# Patient Record
Sex: Female | Born: 1945
Health system: Southern US, Community
[De-identification: ages and names within clinical notes are randomized; demographics above are authoritative.]

## PROBLEM LIST (undated history)

## (undated) DIAGNOSIS — S82121A Displaced fracture of lateral condyle of right tibia, initial encounter for closed fracture: Secondary | ICD-10-CM

## (undated) DIAGNOSIS — E785 Hyperlipidemia, unspecified: Secondary | ICD-10-CM

## (undated) DIAGNOSIS — I10 Essential (primary) hypertension: Secondary | ICD-10-CM

## (undated) DIAGNOSIS — F419 Anxiety disorder, unspecified: Secondary | ICD-10-CM

## (undated) HISTORY — PX: ABDOMINAL HYSTERECTOMY: SHX81

---

## 1998-04-02 ENCOUNTER — Other Ambulatory Visit: Admission: RE | Admit: 1998-04-02 | Discharge: 1998-04-02 | Payer: Self-pay | Admitting: Obstetrics and Gynecology

## 1998-07-03 ENCOUNTER — Encounter: Payer: Self-pay | Admitting: Emergency Medicine

## 1998-07-03 ENCOUNTER — Emergency Department (HOSPITAL_COMMUNITY): Admission: EM | Admit: 1998-07-03 | Discharge: 1998-07-03 | Payer: Self-pay | Admitting: *Deleted

## 1999-05-16 HISTORY — PX: NECK SURGERY: SHX720

## 1999-11-04 ENCOUNTER — Emergency Department (HOSPITAL_COMMUNITY): Admission: EM | Admit: 1999-11-04 | Discharge: 1999-11-04 | Payer: Self-pay | Admitting: Emergency Medicine

## 1999-11-04 ENCOUNTER — Encounter: Payer: Self-pay | Admitting: Emergency Medicine

## 2002-03-04 ENCOUNTER — Other Ambulatory Visit: Admission: RE | Admit: 2002-03-04 | Discharge: 2002-03-04 | Payer: Self-pay | Admitting: Family Medicine

## 2003-01-30 ENCOUNTER — Ambulatory Visit (HOSPITAL_COMMUNITY): Admission: RE | Admit: 2003-01-30 | Discharge: 2003-01-30 | Payer: Self-pay | Admitting: Family Medicine

## 2003-01-30 ENCOUNTER — Encounter: Payer: Self-pay | Admitting: Family Medicine

## 2004-04-21 ENCOUNTER — Ambulatory Visit (HOSPITAL_COMMUNITY): Admission: RE | Admit: 2004-04-21 | Discharge: 2004-04-21 | Payer: Self-pay | Admitting: Family Medicine

## 2004-09-06 ENCOUNTER — Encounter: Admission: RE | Admit: 2004-09-06 | Discharge: 2004-09-06 | Payer: Self-pay | Admitting: Family Medicine

## 2004-11-12 ENCOUNTER — Emergency Department (HOSPITAL_COMMUNITY): Admission: EM | Admit: 2004-11-12 | Discharge: 2004-11-12 | Payer: Self-pay | Admitting: Emergency Medicine

## 2005-03-15 ENCOUNTER — Ambulatory Visit (HOSPITAL_COMMUNITY): Admission: RE | Admit: 2005-03-15 | Discharge: 2005-03-15 | Payer: Self-pay | Admitting: Gastroenterology

## 2005-08-17 ENCOUNTER — Ambulatory Visit (HOSPITAL_COMMUNITY): Admission: RE | Admit: 2005-08-17 | Discharge: 2005-08-17 | Payer: Self-pay | Admitting: Family Medicine

## 2006-11-02 ENCOUNTER — Ambulatory Visit (HOSPITAL_COMMUNITY): Admission: RE | Admit: 2006-11-02 | Discharge: 2006-11-02 | Payer: Self-pay | Admitting: Family Medicine

## 2007-02-05 ENCOUNTER — Emergency Department (HOSPITAL_COMMUNITY): Admission: EM | Admit: 2007-02-05 | Discharge: 2007-02-05 | Payer: Self-pay | Admitting: Emergency Medicine

## 2008-07-30 ENCOUNTER — Ambulatory Visit (HOSPITAL_COMMUNITY): Admission: RE | Admit: 2008-07-30 | Discharge: 2008-07-30 | Payer: Self-pay | Admitting: Family Medicine

## 2010-02-08 ENCOUNTER — Ambulatory Visit (HOSPITAL_COMMUNITY): Admission: RE | Admit: 2010-02-08 | Discharge: 2010-02-08 | Payer: Self-pay | Admitting: Family Medicine

## 2010-06-05 ENCOUNTER — Encounter: Payer: Self-pay | Admitting: Family Medicine

## 2010-09-30 NOTE — Op Note (Signed)
NAME:  Heather Houston, Heather Houston NO.:  [id number]   MEDICAL RECORD NO.:  [id number]          PATIENT TYPE:  AMB   LOCATION:  ENDO                         FACILITY:  [facility]   PHYSICIAN:  Anselmo Rod, M.D.  DATE OF BIRTH:  10-05-1945   DATE OF PROCEDURE:  03/15/2005  DATE OF DISCHARGE:                                 OPERATIVE REPORT   PROCEDURE PERFORMED:  Screening colonoscopy.   ENDOSCOPIST:  Charna Elizabeth, M.D.   INSTRUMENT USED:  Olympus video colonoscope.   INDICATIONS FOR PROCEDURE:  65 year old African-American female with history  of occasional rectal bleeding.  Rule out colonic polyps, masses, etc.   PREPROCEDURE PREPARATION:  Informed consent was procured from the patient.  The patient was fasted for eight hours prior to the procedure and prepped  with a bottle of magnesium citrate and a gallon of GoLytely the night prior  to the procedure.  The risks and benefits of the procedure including a 10%  miss rate for polyps or cancers was discussed with the patient as well.   PREPROCEDURE PHYSICAL:  The patient had stable vital signs.  Neck supple.  Chest clear to auscultation.  S1 and S2 regular.  Abdomen soft with normal  bowel sounds.   DESCRIPTION OF PROCEDURE:  The patient was placed in left lateral decubitus  position and sedated with 130 mg of Demerol and 12.5 mg of Versed in slow  incremental doses.  Once the patient was adequately sedated and maintained  on low flow oxygen and continuous cardiac monitoring, the Olympus video  colonoscope was advanced from the rectum to the transverse colon with  difficulty.  The scope could not be advanced beyond that point and therefore  the adult scope was withdrawn and the pediatric scope was used instead.  We  were able to advance the pediatric scope up to the cecum with gentle turning  of the patient from the left lateral to the supine and the right lateral  position with application of abdominal pressure.  The  appendicular orifice  and ileocecal valve were clearly visualized and photographed.  Small  internal hemorrhoids were seen on retroflexion. No masses or polyps were  identified.  There was no evidence of diverticulosis.  The patient tolerated  the procedure well without complication.   IMPRESSION:  1.  Normal colonoscopy up to the cecum.  No masses, polyps, or      diverticulosis identified.  2.  Small internal hemorrhoids seen on retroflexion.   RECOMMENDATIONS:  1.  Continue high fiber diet with liberal fluid intake.  2.  Repeat colonoscopy in the next 10 years unless the patient were to      develop any abnormal symptoms in the interim.  3.  Outpatient followup as need arises in the future.      Anselmo Rod, M.D.  Electronically Signed     JNM/MEDQ  D:  03/15/2005  T:  03/15/2005  Job:  [id number]

## 2011-04-03 ENCOUNTER — Other Ambulatory Visit (HOSPITAL_COMMUNITY): Payer: Self-pay | Admitting: Family Medicine

## 2011-04-03 DIAGNOSIS — Z1231 Encounter for screening mammogram for malignant neoplasm of breast: Secondary | ICD-10-CM

## 2011-05-04 ENCOUNTER — Ambulatory Visit (HOSPITAL_COMMUNITY)
Admission: RE | Admit: 2011-05-04 | Discharge: 2011-05-04 | Disposition: A | Discharge status: Home / Self Care | Payer: PRIVATE HEALTH INSURANCE | Source: Ambulatory Visit | Attending: Family Medicine | Admitting: Family Medicine

## 2011-05-04 DIAGNOSIS — Z1231 Encounter for screening mammogram for malignant neoplasm of breast: Secondary | ICD-10-CM | POA: Insufficient documentation

## 2012-05-27 ENCOUNTER — Other Ambulatory Visit (HOSPITAL_COMMUNITY): Payer: Self-pay | Admitting: Family Medicine

## 2012-05-27 DIAGNOSIS — Z1231 Encounter for screening mammogram for malignant neoplasm of breast: Secondary | ICD-10-CM

## 2012-05-31 ENCOUNTER — Ambulatory Visit (HOSPITAL_COMMUNITY): Payer: PRIVATE HEALTH INSURANCE

## 2012-06-07 ENCOUNTER — Other Ambulatory Visit (HOSPITAL_COMMUNITY): Payer: Self-pay | Admitting: Family Medicine

## 2012-06-07 DIAGNOSIS — Z79890 Hormone replacement therapy (postmenopausal): Secondary | ICD-10-CM

## 2012-06-10 ENCOUNTER — Ambulatory Visit (HOSPITAL_COMMUNITY): Payer: PRIVATE HEALTH INSURANCE | Attending: Family Medicine

## 2012-06-10 ENCOUNTER — Ambulatory Visit (HOSPITAL_COMMUNITY): Payer: PRIVATE HEALTH INSURANCE

## 2012-07-05 ENCOUNTER — Ambulatory Visit (HOSPITAL_COMMUNITY): Payer: PRIVATE HEALTH INSURANCE

## 2012-07-17 ENCOUNTER — Ambulatory Visit (HOSPITAL_COMMUNITY)
Admission: RE | Admit: 2012-07-17 | Discharge: 2012-07-17 | Disposition: A | Discharge status: Home / Self Care | Payer: PRIVATE HEALTH INSURANCE | Source: Ambulatory Visit | Attending: Family Medicine | Admitting: Family Medicine

## 2012-07-17 DIAGNOSIS — Z1231 Encounter for screening mammogram for malignant neoplasm of breast: Secondary | ICD-10-CM | POA: Insufficient documentation

## 2012-07-17 DIAGNOSIS — Z79890 Hormone replacement therapy (postmenopausal): Secondary | ICD-10-CM | POA: Insufficient documentation

## 2012-07-17 DIAGNOSIS — Z13820 Encounter for screening for osteoporosis: Secondary | ICD-10-CM | POA: Insufficient documentation

## 2012-07-17 DIAGNOSIS — Z780 Asymptomatic menopausal state: Secondary | ICD-10-CM | POA: Insufficient documentation

## 2013-09-24 ENCOUNTER — Other Ambulatory Visit (HOSPITAL_COMMUNITY): Payer: Self-pay | Admitting: Family Medicine

## 2013-09-24 DIAGNOSIS — Z1231 Encounter for screening mammogram for malignant neoplasm of breast: Secondary | ICD-10-CM

## 2013-09-30 ENCOUNTER — Ambulatory Visit (HOSPITAL_COMMUNITY): Payer: PRIVATE HEALTH INSURANCE

## 2013-10-01 ENCOUNTER — Ambulatory Visit (HOSPITAL_COMMUNITY)
Admission: RE | Admit: 2013-10-01 | Discharge: 2013-10-01 | Disposition: A | Discharge status: Home / Self Care | Payer: PRIVATE HEALTH INSURANCE | Source: Ambulatory Visit | Attending: Family Medicine | Admitting: Family Medicine

## 2013-10-01 DIAGNOSIS — Z1231 Encounter for screening mammogram for malignant neoplasm of breast: Secondary | ICD-10-CM

## 2014-10-08 DIAGNOSIS — Z79899 Other long term (current) drug therapy: Secondary | ICD-10-CM | POA: Diagnosis not present

## 2014-10-08 DIAGNOSIS — E780 Pure hypercholesterolemia: Secondary | ICD-10-CM | POA: Diagnosis not present

## 2014-10-13 ENCOUNTER — Other Ambulatory Visit (HOSPITAL_COMMUNITY): Payer: Self-pay | Admitting: Family Medicine

## 2014-10-13 DIAGNOSIS — Z1231 Encounter for screening mammogram for malignant neoplasm of breast: Secondary | ICD-10-CM

## 2014-10-13 DIAGNOSIS — Z79890 Hormone replacement therapy (postmenopausal): Secondary | ICD-10-CM | POA: Diagnosis not present

## 2014-10-13 DIAGNOSIS — M545 Low back pain: Secondary | ICD-10-CM | POA: Diagnosis not present

## 2014-10-13 DIAGNOSIS — E780 Pure hypercholesterolemia: Secondary | ICD-10-CM | POA: Diagnosis not present

## 2014-10-13 DIAGNOSIS — Z9181 History of falling: Secondary | ICD-10-CM | POA: Diagnosis not present

## 2014-10-13 DIAGNOSIS — I10 Essential (primary) hypertension: Secondary | ICD-10-CM | POA: Diagnosis not present

## 2014-10-20 ENCOUNTER — Ambulatory Visit (HOSPITAL_COMMUNITY)
Admission: RE | Admit: 2014-10-20 | Discharge: 2014-10-20 | Disposition: A | Discharge status: Home / Self Care | Payer: Medicare Other | Source: Ambulatory Visit | Attending: Family Medicine | Admitting: Family Medicine

## 2014-10-20 DIAGNOSIS — Z1231 Encounter for screening mammogram for malignant neoplasm of breast: Secondary | ICD-10-CM | POA: Diagnosis not present

## 2015-01-13 DIAGNOSIS — Z111 Encounter for screening for respiratory tuberculosis: Secondary | ICD-10-CM | POA: Diagnosis not present

## 2015-03-22 DIAGNOSIS — H25813 Combined forms of age-related cataract, bilateral: Secondary | ICD-10-CM | POA: Diagnosis not present

## 2015-04-09 DIAGNOSIS — Z79899 Other long term (current) drug therapy: Secondary | ICD-10-CM | POA: Diagnosis not present

## 2015-04-09 DIAGNOSIS — E7800 Pure hypercholesterolemia, unspecified: Secondary | ICD-10-CM | POA: Diagnosis not present

## 2015-04-13 DIAGNOSIS — Z1389 Encounter for screening for other disorder: Secondary | ICD-10-CM | POA: Diagnosis not present

## 2015-04-13 DIAGNOSIS — F329 Major depressive disorder, single episode, unspecified: Secondary | ICD-10-CM | POA: Diagnosis not present

## 2015-04-13 DIAGNOSIS — Z23 Encounter for immunization: Secondary | ICD-10-CM | POA: Diagnosis not present

## 2015-04-13 DIAGNOSIS — I10 Essential (primary) hypertension: Secondary | ICD-10-CM | POA: Diagnosis not present

## 2015-04-13 DIAGNOSIS — M545 Low back pain: Secondary | ICD-10-CM | POA: Diagnosis not present

## 2015-04-13 DIAGNOSIS — E7800 Pure hypercholesterolemia, unspecified: Secondary | ICD-10-CM | POA: Diagnosis not present

## 2015-05-26 DIAGNOSIS — J019 Acute sinusitis, unspecified: Secondary | ICD-10-CM | POA: Diagnosis not present

## 2015-05-26 DIAGNOSIS — Z6827 Body mass index (BMI) 27.0-27.9, adult: Secondary | ICD-10-CM | POA: Diagnosis not present

## 2015-05-26 DIAGNOSIS — H6123 Impacted cerumen, bilateral: Secondary | ICD-10-CM | POA: Diagnosis not present

## 2015-09-03 DIAGNOSIS — B373 Candidiasis of vulva and vagina: Secondary | ICD-10-CM | POA: Diagnosis not present

## 2015-09-03 DIAGNOSIS — Z6827 Body mass index (BMI) 27.0-27.9, adult: Secondary | ICD-10-CM | POA: Diagnosis not present

## 2015-09-03 DIAGNOSIS — Z139 Encounter for screening, unspecified: Secondary | ICD-10-CM | POA: Diagnosis not present

## 2015-09-03 DIAGNOSIS — R300 Dysuria: Secondary | ICD-10-CM | POA: Diagnosis not present

## 2015-10-13 DIAGNOSIS — E7800 Pure hypercholesterolemia, unspecified: Secondary | ICD-10-CM | POA: Diagnosis not present

## 2015-10-13 DIAGNOSIS — Z79899 Other long term (current) drug therapy: Secondary | ICD-10-CM | POA: Diagnosis not present

## 2015-10-15 DIAGNOSIS — M545 Low back pain: Secondary | ICD-10-CM | POA: Diagnosis not present

## 2015-10-15 DIAGNOSIS — F329 Major depressive disorder, single episode, unspecified: Secondary | ICD-10-CM | POA: Diagnosis not present

## 2015-10-15 DIAGNOSIS — Z9181 History of falling: Secondary | ICD-10-CM | POA: Diagnosis not present

## 2015-10-15 DIAGNOSIS — I10 Essential (primary) hypertension: Secondary | ICD-10-CM | POA: Diagnosis not present

## 2015-10-15 DIAGNOSIS — R002 Palpitations: Secondary | ICD-10-CM | POA: Diagnosis not present

## 2015-10-15 DIAGNOSIS — Z6828 Body mass index (BMI) 28.0-28.9, adult: Secondary | ICD-10-CM | POA: Diagnosis not present

## 2015-10-15 DIAGNOSIS — E7800 Pure hypercholesterolemia, unspecified: Secondary | ICD-10-CM | POA: Diagnosis not present

## 2015-10-15 DIAGNOSIS — K219 Gastro-esophageal reflux disease without esophagitis: Secondary | ICD-10-CM | POA: Diagnosis not present

## 2015-10-15 DIAGNOSIS — Z79890 Hormone replacement therapy (postmenopausal): Secondary | ICD-10-CM | POA: Diagnosis not present

## 2015-10-27 DIAGNOSIS — E2839 Other primary ovarian failure: Secondary | ICD-10-CM | POA: Diagnosis not present

## 2015-10-27 DIAGNOSIS — Z1231 Encounter for screening mammogram for malignant neoplasm of breast: Secondary | ICD-10-CM | POA: Diagnosis not present

## 2015-10-27 DIAGNOSIS — Z13820 Encounter for screening for osteoporosis: Secondary | ICD-10-CM | POA: Diagnosis not present

## 2015-11-19 DIAGNOSIS — K635 Polyp of colon: Secondary | ICD-10-CM | POA: Diagnosis not present

## 2015-11-19 DIAGNOSIS — Z1211 Encounter for screening for malignant neoplasm of colon: Secondary | ICD-10-CM | POA: Diagnosis not present

## 2016-04-14 DIAGNOSIS — I10 Essential (primary) hypertension: Secondary | ICD-10-CM | POA: Diagnosis not present

## 2016-04-19 DIAGNOSIS — Z1389 Encounter for screening for other disorder: Secondary | ICD-10-CM | POA: Diagnosis not present

## 2016-04-19 DIAGNOSIS — K219 Gastro-esophageal reflux disease without esophagitis: Secondary | ICD-10-CM | POA: Diagnosis not present

## 2016-04-19 DIAGNOSIS — E663 Overweight: Secondary | ICD-10-CM | POA: Diagnosis not present

## 2016-04-19 DIAGNOSIS — Z23 Encounter for immunization: Secondary | ICD-10-CM | POA: Diagnosis not present

## 2016-04-19 DIAGNOSIS — R143 Flatulence: Secondary | ICD-10-CM | POA: Diagnosis not present

## 2016-04-19 DIAGNOSIS — Z6828 Body mass index (BMI) 28.0-28.9, adult: Secondary | ICD-10-CM | POA: Diagnosis not present

## 2016-04-19 DIAGNOSIS — R002 Palpitations: Secondary | ICD-10-CM | POA: Diagnosis not present

## 2016-04-19 DIAGNOSIS — E7800 Pure hypercholesterolemia, unspecified: Secondary | ICD-10-CM | POA: Diagnosis not present

## 2016-04-19 DIAGNOSIS — I10 Essential (primary) hypertension: Secondary | ICD-10-CM | POA: Diagnosis not present

## 2016-06-20 DIAGNOSIS — J069 Acute upper respiratory infection, unspecified: Secondary | ICD-10-CM | POA: Diagnosis not present

## 2016-06-20 DIAGNOSIS — R05 Cough: Secondary | ICD-10-CM | POA: Diagnosis not present

## 2016-08-03 DIAGNOSIS — E663 Overweight: Secondary | ICD-10-CM | POA: Diagnosis not present

## 2016-08-03 DIAGNOSIS — Z6827 Body mass index (BMI) 27.0-27.9, adult: Secondary | ICD-10-CM | POA: Diagnosis not present

## 2016-08-03 DIAGNOSIS — M545 Low back pain: Secondary | ICD-10-CM | POA: Diagnosis not present

## 2016-09-04 DIAGNOSIS — Z111 Encounter for screening for respiratory tuberculosis: Secondary | ICD-10-CM | POA: Diagnosis not present

## 2016-10-25 DIAGNOSIS — E7800 Pure hypercholesterolemia, unspecified: Secondary | ICD-10-CM | POA: Diagnosis not present

## 2016-10-25 DIAGNOSIS — I10 Essential (primary) hypertension: Secondary | ICD-10-CM | POA: Diagnosis not present

## 2016-11-03 DIAGNOSIS — Z136 Encounter for screening for cardiovascular disorders: Secondary | ICD-10-CM | POA: Diagnosis not present

## 2016-11-03 DIAGNOSIS — Z1231 Encounter for screening mammogram for malignant neoplasm of breast: Secondary | ICD-10-CM | POA: Diagnosis not present

## 2016-11-03 DIAGNOSIS — E785 Hyperlipidemia, unspecified: Secondary | ICD-10-CM | POA: Diagnosis not present

## 2016-11-03 DIAGNOSIS — Z1389 Encounter for screening for other disorder: Secondary | ICD-10-CM | POA: Diagnosis not present

## 2016-11-03 DIAGNOSIS — Z139 Encounter for screening, unspecified: Secondary | ICD-10-CM | POA: Diagnosis not present

## 2016-11-03 DIAGNOSIS — Z0000 Encounter for general adult medical examination without abnormal findings: Secondary | ICD-10-CM | POA: Diagnosis not present

## 2016-11-03 DIAGNOSIS — Z9181 History of falling: Secondary | ICD-10-CM | POA: Diagnosis not present

## 2016-12-04 DIAGNOSIS — M545 Low back pain: Secondary | ICD-10-CM | POA: Diagnosis not present

## 2016-12-04 DIAGNOSIS — F329 Major depressive disorder, single episode, unspecified: Secondary | ICD-10-CM | POA: Diagnosis not present

## 2016-12-04 DIAGNOSIS — Z6829 Body mass index (BMI) 29.0-29.9, adult: Secondary | ICD-10-CM | POA: Diagnosis not present

## 2016-12-04 DIAGNOSIS — K219 Gastro-esophageal reflux disease without esophagitis: Secondary | ICD-10-CM | POA: Diagnosis not present

## 2016-12-04 DIAGNOSIS — E7800 Pure hypercholesterolemia, unspecified: Secondary | ICD-10-CM | POA: Diagnosis not present

## 2016-12-04 DIAGNOSIS — Z79899 Other long term (current) drug therapy: Secondary | ICD-10-CM | POA: Diagnosis not present

## 2016-12-04 DIAGNOSIS — I10 Essential (primary) hypertension: Secondary | ICD-10-CM | POA: Diagnosis not present

## 2017-02-21 DIAGNOSIS — Z23 Encounter for immunization: Secondary | ICD-10-CM | POA: Diagnosis not present

## 2017-02-21 DIAGNOSIS — R05 Cough: Secondary | ICD-10-CM | POA: Diagnosis not present

## 2017-02-21 DIAGNOSIS — Z6829 Body mass index (BMI) 29.0-29.9, adult: Secondary | ICD-10-CM | POA: Diagnosis not present

## 2017-06-22 DIAGNOSIS — R070 Pain in throat: Secondary | ICD-10-CM | POA: Diagnosis not present

## 2017-06-22 DIAGNOSIS — J029 Acute pharyngitis, unspecified: Secondary | ICD-10-CM | POA: Diagnosis not present

## 2017-08-20 DIAGNOSIS — H18413 Arcus senilis, bilateral: Secondary | ICD-10-CM | POA: Diagnosis not present

## 2017-08-20 DIAGNOSIS — H25813 Combined forms of age-related cataract, bilateral: Secondary | ICD-10-CM | POA: Diagnosis not present

## 2017-10-29 DIAGNOSIS — Z6829 Body mass index (BMI) 29.0-29.9, adult: Secondary | ICD-10-CM | POA: Diagnosis not present

## 2017-10-29 DIAGNOSIS — Z79899 Other long term (current) drug therapy: Secondary | ICD-10-CM | POA: Diagnosis not present

## 2017-10-29 DIAGNOSIS — E7800 Pure hypercholesterolemia, unspecified: Secondary | ICD-10-CM | POA: Diagnosis not present

## 2017-10-29 DIAGNOSIS — F329 Major depressive disorder, single episode, unspecified: Secondary | ICD-10-CM | POA: Diagnosis not present

## 2017-10-29 DIAGNOSIS — Z1231 Encounter for screening mammogram for malignant neoplasm of breast: Secondary | ICD-10-CM | POA: Diagnosis not present

## 2017-10-29 DIAGNOSIS — Z1331 Encounter for screening for depression: Secondary | ICD-10-CM | POA: Diagnosis not present

## 2017-10-29 DIAGNOSIS — I10 Essential (primary) hypertension: Secondary | ICD-10-CM | POA: Diagnosis not present

## 2018-02-22 DIAGNOSIS — M25511 Pain in right shoulder: Secondary | ICD-10-CM | POA: Diagnosis not present

## 2018-02-22 DIAGNOSIS — M25611 Stiffness of right shoulder, not elsewhere classified: Secondary | ICD-10-CM | POA: Diagnosis not present

## 2018-03-01 DIAGNOSIS — M25511 Pain in right shoulder: Secondary | ICD-10-CM | POA: Diagnosis not present

## 2018-03-01 DIAGNOSIS — M25611 Stiffness of right shoulder, not elsewhere classified: Secondary | ICD-10-CM | POA: Diagnosis not present

## 2018-05-03 DIAGNOSIS — Z139 Encounter for screening, unspecified: Secondary | ICD-10-CM | POA: Diagnosis not present

## 2018-05-03 DIAGNOSIS — M1711 Unilateral primary osteoarthritis, right knee: Secondary | ICD-10-CM | POA: Diagnosis not present

## 2018-05-03 DIAGNOSIS — R634 Abnormal weight loss: Secondary | ICD-10-CM | POA: Diagnosis not present

## 2018-05-03 DIAGNOSIS — M545 Low back pain: Secondary | ICD-10-CM | POA: Diagnosis not present

## 2018-05-03 DIAGNOSIS — M170 Bilateral primary osteoarthritis of knee: Secondary | ICD-10-CM | POA: Diagnosis not present

## 2018-05-03 DIAGNOSIS — E7800 Pure hypercholesterolemia, unspecified: Secondary | ICD-10-CM | POA: Diagnosis not present

## 2018-05-03 DIAGNOSIS — Z23 Encounter for immunization: Secondary | ICD-10-CM | POA: Diagnosis not present

## 2018-05-03 DIAGNOSIS — I10 Essential (primary) hypertension: Secondary | ICD-10-CM | POA: Diagnosis not present

## 2018-05-03 DIAGNOSIS — M25562 Pain in left knee: Secondary | ICD-10-CM | POA: Diagnosis not present

## 2018-05-03 DIAGNOSIS — M1712 Unilateral primary osteoarthritis, left knee: Secondary | ICD-10-CM | POA: Diagnosis not present

## 2018-05-03 DIAGNOSIS — F329 Major depressive disorder, single episode, unspecified: Secondary | ICD-10-CM | POA: Diagnosis not present

## 2018-05-03 DIAGNOSIS — M25561 Pain in right knee: Secondary | ICD-10-CM | POA: Diagnosis not present

## 2018-05-03 DIAGNOSIS — E2839 Other primary ovarian failure: Secondary | ICD-10-CM | POA: Diagnosis not present

## 2018-05-06 DIAGNOSIS — Z136 Encounter for screening for cardiovascular disorders: Secondary | ICD-10-CM | POA: Diagnosis not present

## 2018-05-06 DIAGNOSIS — Z139 Encounter for screening, unspecified: Secondary | ICD-10-CM | POA: Diagnosis not present

## 2018-05-06 DIAGNOSIS — Z9181 History of falling: Secondary | ICD-10-CM | POA: Diagnosis not present

## 2018-05-06 DIAGNOSIS — Z0000 Encounter for general adult medical examination without abnormal findings: Secondary | ICD-10-CM | POA: Diagnosis not present

## 2018-05-06 DIAGNOSIS — E785 Hyperlipidemia, unspecified: Secondary | ICD-10-CM | POA: Diagnosis not present

## 2018-05-23 ENCOUNTER — Inpatient Hospital Stay (HOSPITAL_COMMUNITY)
Admission: EM | Admit: 2018-05-23 | Discharge: 2018-05-29 | DRG: 563 | Disposition: A | Discharge status: Home Health | Payer: Medicare HMO | Attending: Orthopedic Surgery | Admitting: Orthopedic Surgery

## 2018-05-23 ENCOUNTER — Emergency Department (HOSPITAL_COMMUNITY): Payer: Medicare HMO

## 2018-05-23 ENCOUNTER — Other Ambulatory Visit: Payer: Self-pay

## 2018-05-23 ENCOUNTER — Encounter (HOSPITAL_COMMUNITY): Payer: Self-pay | Admitting: Emergency Medicine

## 2018-05-23 DIAGNOSIS — W19XXXA Unspecified fall, initial encounter: Secondary | ICD-10-CM

## 2018-05-23 DIAGNOSIS — F419 Anxiety disorder, unspecified: Secondary | ICD-10-CM | POA: Diagnosis not present

## 2018-05-23 DIAGNOSIS — W1789XA Other fall from one level to another, initial encounter: Secondary | ICD-10-CM | POA: Diagnosis present

## 2018-05-23 DIAGNOSIS — E785 Hyperlipidemia, unspecified: Secondary | ICD-10-CM | POA: Diagnosis present

## 2018-05-23 DIAGNOSIS — S82141A Displaced bicondylar fracture of right tibia, initial encounter for closed fracture: Secondary | ICD-10-CM | POA: Diagnosis not present

## 2018-05-23 DIAGNOSIS — I10 Essential (primary) hypertension: Secondary | ICD-10-CM | POA: Diagnosis not present

## 2018-05-23 DIAGNOSIS — Y9289 Other specified places as the place of occurrence of the external cause: Secondary | ICD-10-CM

## 2018-05-23 DIAGNOSIS — M25561 Pain in right knee: Secondary | ICD-10-CM | POA: Diagnosis not present

## 2018-05-23 DIAGNOSIS — S8991XA Unspecified injury of right lower leg, initial encounter: Secondary | ICD-10-CM | POA: Diagnosis not present

## 2018-05-23 DIAGNOSIS — S82121A Displaced fracture of lateral condyle of right tibia, initial encounter for closed fracture: Secondary | ICD-10-CM | POA: Diagnosis present

## 2018-05-23 DIAGNOSIS — S82191A Other fracture of upper end of right tibia, initial encounter for closed fracture: Secondary | ICD-10-CM | POA: Diagnosis not present

## 2018-05-23 DIAGNOSIS — Z79899 Other long term (current) drug therapy: Secondary | ICD-10-CM

## 2018-05-23 DIAGNOSIS — S82144A Nondisplaced bicondylar fracture of right tibia, initial encounter for closed fracture: Secondary | ICD-10-CM | POA: Diagnosis not present

## 2018-05-23 HISTORY — DX: Hyperlipidemia, unspecified: E78.5

## 2018-05-23 HISTORY — DX: Essential (primary) hypertension: I10

## 2018-05-23 HISTORY — DX: Anxiety disorder, unspecified: F41.9

## 2018-05-23 HISTORY — DX: Displaced fracture of lateral condyle of right tibia, initial encounter for closed fracture: S82.121A

## 2018-05-23 MED ORDER — OXYCODONE-ACETAMINOPHEN 5-325 MG PO TABS
1.0000 | ORAL_TABLET | ORAL | Status: AC | PRN
  Administered 2018-05-23 – 2018-05-26 (×2): 1 via ORAL
  Filled 2018-05-23 (×2): qty 1

## 2018-05-23 NOTE — ED Triage Notes (Signed)
Patient was at a game tonight and fell down a few bleachers and landed on right leg, knee especially.  She states she is unable to put weight on her right leg.  Swelling noted to right knee.

## 2018-05-24 ENCOUNTER — Emergency Department (HOSPITAL_COMMUNITY): Payer: Medicare HMO

## 2018-05-24 ENCOUNTER — Other Ambulatory Visit: Payer: Self-pay

## 2018-05-24 ENCOUNTER — Encounter (HOSPITAL_COMMUNITY): Payer: Self-pay | Admitting: Orthopedic Surgery

## 2018-05-24 DIAGNOSIS — S82121A Displaced fracture of lateral condyle of right tibia, initial encounter for closed fracture: Secondary | ICD-10-CM | POA: Diagnosis present

## 2018-05-24 HISTORY — DX: Displaced fracture of lateral condyle of right tibia, initial encounter for closed fracture: S82.121A

## 2018-05-24 LAB — CBC WITH DIFFERENTIAL/PLATELET
Abs Immature Granulocytes: 0.04 10*3/uL (ref 0.00–0.07)
BASOS ABS: 0.1 10*3/uL (ref 0.0–0.1)
BASOS PCT: 0 %
Eosinophils Absolute: 0.1 10*3/uL (ref 0.0–0.5)
Eosinophils Relative: 1 %
HCT: 42 % (ref 36.0–46.0)
Hemoglobin: 12.8 g/dL (ref 12.0–15.0)
IMMATURE GRANULOCYTES: 0 %
LYMPHS ABS: 2.9 10*3/uL (ref 0.7–4.0)
Lymphocytes Relative: 24 %
MCH: 28.2 pg (ref 26.0–34.0)
MCHC: 30.5 g/dL (ref 30.0–36.0)
MCV: 92.5 fL (ref 80.0–100.0)
Monocytes Absolute: 0.7 10*3/uL (ref 0.1–1.0)
Monocytes Relative: 6 %
NEUTROS PCT: 69 %
NRBC: 0 % (ref 0.0–0.2)
Neutro Abs: 8.4 10*3/uL — ABNORMAL HIGH (ref 1.7–7.7)
PLATELETS: 227 10*3/uL (ref 150–400)
RBC: 4.54 MIL/uL (ref 3.87–5.11)
RDW: 13.5 % (ref 11.5–15.5)
WBC: 12.3 10*3/uL — AB (ref 4.0–10.5)

## 2018-05-24 LAB — BASIC METABOLIC PANEL
ANION GAP: 9 (ref 5–15)
BUN: 16 mg/dL (ref 8–23)
CO2: 24 mmol/L (ref 22–32)
CREATININE: 0.86 mg/dL (ref 0.44–1.00)
Calcium: 8.8 mg/dL — ABNORMAL LOW (ref 8.9–10.3)
Chloride: 106 mmol/L (ref 98–111)
Glucose, Bld: 147 mg/dL — ABNORMAL HIGH (ref 70–99)
Potassium: 3.5 mmol/L (ref 3.5–5.1)
SODIUM: 139 mmol/L (ref 135–145)

## 2018-05-24 LAB — CREATININE, SERUM
CREATININE: 0.96 mg/dL (ref 0.44–1.00)
GFR calc Af Amer: >60 mL/min (ref >60)
GFR calc non Af Amer: 58 mL/min — ABNORMAL LOW (ref >60)

## 2018-05-24 LAB — CBC
HCT: 42.1 % (ref 36.0–46.0)
Hemoglobin: 13 g/dL (ref 12.0–15.0)
MCH: 29.3 pg (ref 26.0–34.0)
MCHC: 30.9 g/dL (ref 30.0–36.0)
MCV: 94.8 fL (ref 80.0–100.0)
Platelets: 209 10*3/uL (ref 150–400)
RBC: 4.44 MIL/uL (ref 3.87–5.11)
RDW: 13.7 % (ref 11.5–15.5)
WBC: 9.3 10*3/uL (ref 4.0–10.5)
nRBC: 0 % (ref 0.0–0.2)

## 2018-05-24 MED ORDER — ONDANSETRON HCL 4 MG PO TABS: 4.0000 mg | ORAL_TABLET | Freq: Three times a day (TID) | ORAL | 0 refills | Status: AC | PRN

## 2018-05-24 MED ORDER — HYDROMORPHONE HCL 1 MG/ML IJ SOLN
0.5000 mg | Freq: Once | INTRAMUSCULAR | Status: AC
  Administered 2018-05-24: 0.5 mg via INTRAVENOUS
  Filled 2018-05-24: qty 1

## 2018-05-24 MED ORDER — BACLOFEN 10 MG PO TABS: 10.0000 mg | ORAL_TABLET | Freq: Three times a day (TID) | ORAL | 0 refills | Status: AC

## 2018-05-24 MED ORDER — SENNOSIDES-DOCUSATE SODIUM 8.6-50 MG PO TABS: 1.0000 | ORAL_TABLET | Freq: Every evening | ORAL | Status: DC | PRN

## 2018-05-24 MED ORDER — HYDROCODONE-ACETAMINOPHEN 10-325 MG PO TABS: 1.0000 | ORAL_TABLET | Freq: Four times a day (QID) | ORAL | 0 refills | Status: DC | PRN

## 2018-05-24 MED ORDER — POTASSIUM CHLORIDE IN NACL 20-0.9 MEQ/L-% IV SOLN
INTRAVENOUS | Status: DC
  Administered 2018-05-24 (×2): via INTRAVENOUS
  Filled 2018-05-24 (×2): qty 1000

## 2018-05-24 MED ORDER — ATENOLOL 50 MG PO TABS
50.0000 mg | ORAL_TABLET | Freq: Every day | ORAL | Status: DC
  Administered 2018-05-25 – 2018-05-29 (×5): 50 mg via ORAL
  Filled 2018-05-24 (×6): qty 1

## 2018-05-24 MED ORDER — ADULT MULTIVITAMIN W/MINERALS CH
1.0000 | ORAL_TABLET | Freq: Every day | ORAL | Status: DC
  Administered 2018-05-24 – 2018-05-29 (×6): 1 via ORAL
  Filled 2018-05-24 (×6): qty 1

## 2018-05-24 MED ORDER — ENOXAPARIN SODIUM 40 MG/0.4ML ~~LOC~~ SOLN
40.0000 mg | SUBCUTANEOUS | Status: DC
  Administered 2018-05-24 – 2018-05-29 (×6): 40 mg via SUBCUTANEOUS
  Filled 2018-05-24 (×6): qty 0.4

## 2018-05-24 MED ORDER — ZOLPIDEM TARTRATE 5 MG PO TABS: 5.0000 mg | ORAL_TABLET | Freq: Every evening | ORAL | Status: DC | PRN

## 2018-05-24 MED ORDER — FLEET ENEMA 7-19 GM/118ML RE ENEM: 1.0000 | ENEMA | Freq: Once | RECTAL | Status: DC | PRN

## 2018-05-24 MED ORDER — VENLAFAXINE HCL 37.5 MG PO TABS
18.7500 mg | ORAL_TABLET | Freq: Every day | ORAL | Status: DC
  Administered 2018-05-24 – 2018-05-27 (×4): 18.75 mg via ORAL
  Filled 2018-05-24 (×7): qty 0.5

## 2018-05-24 MED ORDER — METHOCARBAMOL 1000 MG/10ML IJ SOLN
500.0000 mg | Freq: Four times a day (QID) | INTRAVENOUS | Status: DC | PRN
  Administered 2018-05-24: 500 mg via INTRAVENOUS
  Filled 2018-05-24 (×2): qty 5

## 2018-05-24 MED ORDER — HYDROCODONE-ACETAMINOPHEN 10-325 MG PO TABS
1.0000 | ORAL_TABLET | ORAL | Status: DC | PRN
  Administered 2018-05-25 – 2018-05-28 (×6): 2 via ORAL
  Administered 2018-05-28 – 2018-05-29 (×4): 1 via ORAL
  Filled 2018-05-24: qty 1
  Filled 2018-05-24 (×5): qty 2
  Filled 2018-05-24: qty 1
  Filled 2018-05-24 (×2): qty 2
  Filled 2018-05-24: qty 1

## 2018-05-24 MED ORDER — ONDANSETRON HCL 4 MG/2ML IJ SOLN
4.0000 mg | Freq: Four times a day (QID) | INTRAMUSCULAR | Status: DC | PRN
  Administered 2018-05-24: 4 mg via INTRAVENOUS
  Filled 2018-05-24: qty 2

## 2018-05-24 MED ORDER — ONDANSETRON HCL 4 MG PO TABS: 4.0000 mg | ORAL_TABLET | Freq: Four times a day (QID) | ORAL | Status: DC | PRN

## 2018-05-24 MED ORDER — METHOCARBAMOL 500 MG PO TABS
500.0000 mg | ORAL_TABLET | Freq: Four times a day (QID) | ORAL | Status: DC | PRN
  Administered 2018-05-25 – 2018-05-29 (×7): 500 mg via ORAL
  Filled 2018-05-24 (×8): qty 1

## 2018-05-24 MED ORDER — HYDROMORPHONE HCL 1 MG/ML IJ SOLN
0.5000 mg | INTRAMUSCULAR | Status: DC | PRN
  Administered 2018-05-24: 0.5 mg via INTRAVENOUS
  Filled 2018-05-24: qty 1

## 2018-05-24 MED ORDER — OMEGA-3-ACID ETHYL ESTERS 1 G PO CAPS
1.0000 g | ORAL_CAPSULE | Freq: Every day | ORAL | Status: DC
  Administered 2018-05-24 – 2018-05-29 (×6): 1 g via ORAL
  Filled 2018-05-24 (×6): qty 1

## 2018-05-24 MED ORDER — ACETAMINOPHEN 650 MG RE SUPP: 650.0000 mg | Freq: Four times a day (QID) | RECTAL | Status: DC | PRN

## 2018-05-24 MED ORDER — ONDANSETRON HCL 4 MG/2ML IJ SOLN
4.0000 mg | Freq: Once | INTRAMUSCULAR | Status: AC
  Administered 2018-05-24: 4 mg via INTRAVENOUS
  Filled 2018-05-24: qty 2

## 2018-05-24 MED ORDER — ASPIRIN EC 325 MG PO TBEC: 325.0000 mg | DELAYED_RELEASE_TABLET | Freq: Two times a day (BID) | ORAL | 0 refills | Status: AC

## 2018-05-24 MED ORDER — SODIUM CHLORIDE 0.9 % IV SOLN
Freq: Once | INTRAVENOUS | Status: AC
  Administered 2018-05-24: 04:00:00 via INTRAVENOUS

## 2018-05-24 MED ORDER — PANTOPRAZOLE SODIUM 40 MG PO TBEC
80.0000 mg | DELAYED_RELEASE_TABLET | Freq: Every day | ORAL | Status: DC
  Administered 2018-05-24 – 2018-05-29 (×5): 80 mg via ORAL
  Filled 2018-05-24 (×6): qty 2

## 2018-05-24 MED ORDER — DOCUSATE SODIUM 100 MG PO CAPS
100.0000 mg | ORAL_CAPSULE | Freq: Two times a day (BID) | ORAL | Status: DC
  Administered 2018-05-24 – 2018-05-28 (×5): 100 mg via ORAL
  Filled 2018-05-24 (×10): qty 1

## 2018-05-24 MED ORDER — ACETAMINOPHEN 325 MG PO TABS
650.0000 mg | ORAL_TABLET | Freq: Four times a day (QID) | ORAL | Status: DC
  Administered 2018-05-24 – 2018-05-26 (×4): 650 mg via ORAL
  Filled 2018-05-24 (×10): qty 2

## 2018-05-24 MED ORDER — FENTANYL CITRATE (PF) 100 MCG/2ML IJ SOLN
50.0000 ug | Freq: Once | INTRAMUSCULAR | Status: AC
  Administered 2018-05-24: 50 ug via INTRAVENOUS
  Filled 2018-05-24: qty 2

## 2018-05-24 MED ORDER — MORPHINE SULFATE (PF) 4 MG/ML IV SOLN
4.0000 mg | Freq: Once | INTRAVENOUS | Status: DC
  Filled 2018-05-24: qty 1

## 2018-05-24 MED ORDER — DIPHENHYDRAMINE HCL 12.5 MG/5ML PO ELIX: 12.5000 mg | ORAL_SOLUTION | ORAL | Status: DC | PRN

## 2018-05-24 MED ORDER — BISACODYL 10 MG RE SUPP: 10.0000 mg | Freq: Every day | RECTAL | Status: DC | PRN

## 2018-05-24 MED ORDER — SENNA 8.6 MG PO TABS
1.0000 | ORAL_TABLET | Freq: Two times a day (BID) | ORAL | Status: DC
  Administered 2018-05-24 – 2018-05-28 (×5): 8.6 mg via ORAL
  Filled 2018-05-24 (×10): qty 1

## 2018-05-24 MED ORDER — ACETAMINOPHEN 325 MG PO TABS: 650.0000 mg | ORAL_TABLET | Freq: Four times a day (QID) | ORAL | Status: DC | PRN

## 2018-05-24 MED ORDER — SENNA-DOCUSATE SODIUM 8.6-50 MG PO TABS: 2.0000 | ORAL_TABLET | Freq: Every day | ORAL | 1 refills | Status: AC

## 2018-05-24 NOTE — ED Notes (Signed)
Patient transported to CT 

## 2018-05-24 NOTE — ED Notes (Signed)
Pt knee stabilizer removed, clothes removed, ice placed and knee stabilizer re-placed.

## 2018-05-24 NOTE — Evaluation (Addendum)
Physical Therapy Evaluation Patient Details Name: Heather Houston MRN: [id number] DOB: 1945/09/15 Today's Date: 05/24/2018   History of Present Illness  Admit with Right posterior lateral tibial plateau fracture with likely avulsion of the ACL.  MD wants to have conservative managment with knee immobilizer.    Clinical Impression  Pt admitted with above diagnosis. Pt currently with functional limitations due to the deficits listed below (see PT Problem List). Pt was able to transfer to chair  with min guard assist with ability to maintain TDWB right LE.  Pt overall did well but does not have someone that can help her at all times on d/c.  Would benefit from SNF for pt to heal.  If SNf is not approved, recommend HHPT, HHOT and HHaide.  Would pt qualify for Home First program?  Equipment as below as well.  Pt will benefit from skilled PT to increase their independence and safety with mobility to allow discharge to the venue listed below.      Follow Up Recommendations SNF(has no 24 hour care; if home is d/c plan, HHPT,HHOT, HHAide )    Equipment Recommendations  Rolling walker with 5" wheels;3in1 (PT);Wheelchair (18x16 lightweight, anti-tippers, desk armrests,elevating legrests);Wheelchair cushion (18x16 pressure relieving cushion)    Recommendations for Other Services       Precautions / Restrictions Precautions Precautions: Fall;Knee Required Braces or Orthoses: Knee Immobilizer - Right Knee Immobilizer - Right: On at all times Restrictions Weight Bearing Restrictions: Yes RLE Weight Bearing: Touchdown weight bearing      Mobility  Bed Mobility Overal bed mobility: Needs Assistance Bed Mobility: Supine to Sit     Supine to sit: Min assist     General bed mobility comments: Assisted to move right LE to EOB.   Transfers Overall transfer level: Needs assistance Equipment used: Rolling walker (2 wheeled) Transfers: Sit to/from UGI CorporationStand;Stand Pivot Transfers Sit to Stand: Min  guard Stand pivot transfers: Min guard       General transfer comment: Pt was able to come to standing with only guard assist and cues.  Able to maintain TTWB right LE to pivot to chair with RW with good technique overall.   Ambulation/Gait             General Gait Details: Deferred due to first time pt was up and she may do transfers only at home  Stairs            Wheelchair Mobility    Modified Rankin (Stroke Patients Only)       Balance Overall balance assessment: Needs assistance Sitting-balance support: Feet supported;No upper extremity supported Sitting balance-Leahy Scale: Fair     Standing balance support: Bilateral upper extremity supported;During functional activity Standing balance-Leahy Scale: Poor Standing balance comment: Relies on UE support for balance on RW                              Pertinent Vitals/Pain Pain Assessment: 0-10 Pain Score: 6  Pain Location: Right LE Pain Descriptors / Indicators: Aching;Grimacing;Guarding Pain Intervention(s): Limited activity within patient's tolerance;Monitored during session;Repositioned    Home Living Family/patient expects to be discharged to:: Private residence Living Arrangements: Children(son lives with pt) Available Help at Discharge: Family;Available PRN/intermittently Type of Home: House Home Access: Stairs to enter Entrance Stairs-Rails: Right;Left;Can reach both Entrance Stairs-Number of Steps: 2 Home Layout: One level Home Equipment: Crutches      Prior Function Level of Independence: Independent  Comments: pt still works full time at [employer]   Dominant Hand: Right    Extremity/Trunk Assessment   Upper Extremity Assessment Upper Extremity Assessment: Defer to OT evaluation    Lower Extremity Assessment Lower Extremity Assessment: RLE deficits/detail RLE: Unable to fully assess due to pain;Unable to fully assess due to  immobilization    Cervical / Trunk Assessment Cervical / Trunk Assessment: Normal  Communication   Communication: No difficulties  Cognition Arousal/Alertness: Awake/alert Behavior During Therapy: WFL for tasks assessed/performed Overall Cognitive Status: Within Functional Limits for tasks assessed                                        General Comments      Exercises     Assessment/Plan    PT Assessment Patient needs continued PT services  PT Problem List Decreased activity tolerance;Decreased balance;Decreased mobility;Decreased knowledge of use of DME;Decreased safety awareness;Decreased knowledge of precautions;Pain       PT Treatment Interventions DME instruction;Gait training;Functional mobility training;Therapeutic activities;Therapeutic exercise;Stair training;Balance training;Patient/family education;Wheelchair mobility training    PT Goals (Current goals can be found in the Care Plan section)  Acute Rehab PT Goals Patient Stated Goal: to go for rehab PT Goal Formulation: With patient Time For Goal Achievement: 06/07/18 Potential to Achieve Goals: Good    Frequency Min 6X/week   Barriers to discharge Decreased caregiver support      Co-evaluation               AM-PAC PT "6 Clicks" Mobility  Outcome Measure Help needed turning from your back to your side while in a flat bed without using bedrails?: None Help needed moving from lying on your back to sitting on the side of a flat bed without using bedrails?: A Little Help needed moving to and from a bed to a chair (including a wheelchair)?: A Little Help needed standing up from a chair using your arms (e.g., wheelchair or bedside chair)?: A Little Help needed to walk in hospital room?: A Little Help needed climbing 3-5 steps with a railing? : A Lot 6 Click Score: 18    End of Session Equipment Utilized During Treatment: Gait belt;Right knee immobilizer Activity Tolerance: Patient  limited by fatigue;Patient limited by pain Patient left: in chair;with call bell/phone within reach;with family/visitor present Nurse Communication: Mobility status PT Visit Diagnosis: Unsteadiness on feet (R26.81);Muscle weakness (generalized) (M62.81);Pain Pain - Right/Left: Right Pain - part of body: Knee    Time: 1451-1520 PT Time Calculation (min) (ACUTE ONLY): 29 min   Charges:   PT Evaluation $PT Eval Moderate Complexity: 1 Mod PT Treatments $Gait Training: 8-22 mins        Graison Leinberger,PT Acute Rehabilitation Services Pager:  [phone]  Office:  [phone]    Berline Lopes 05/24/2018, 4:03 PM

## 2018-05-24 NOTE — H&P (Signed)
Admission H&P  Chief Complaint: Right knee pain  HPI: Heather Houston is a 73 y.o. female who presented to the emergency room after a twisting injury on the bleachers yesterday night.  Acute onset severe pain, inability to ambulate.  Unable to bear weight.  I was called at approximately 330 this morning.  She has attempted to mobilize while in the emergency room, but could not due to pain.  Pain is located over the right knee, worse with movement, better with rest and pain medication.  Denies significant pre-existing problems with the knee.  She lives with her son.  Past Medical History:  Diagnosis Date  . Closed fracture of lateral portion of right tibial plateau 05/24/2018   History reviewed. No pertinent surgical history. Social History   Socioeconomic History  . Marital status: Legally Separated    Spouse name: Not on file  . Number of children: Not on file  . Years of education: Not on file  . Highest education level: Not on file  Occupational History  . Not on file  Social Needs  . Financial resource strain: Not on file  . Food insecurity:    Worry: Not on file    Inability: Not on file  . Transportation needs:    Medical: Not on file    Non-medical: Not on file  Tobacco Use  . Smoking status: Never Smoker  . Smokeless tobacco: Never Used  Substance and Sexual Activity  . Alcohol use: Not Currently    Frequency: Never  . Drug use: Never  . Sexual activity: Not on file  Lifestyle  . Physical activity:    Days per week: Not on file    Minutes per session: Not on file  . Stress: Not on file  Relationships  . Social connections:    Talks on phone: Not on file    Gets together: Not on file    Attends religious service: Not on file    Active member of club or organization: Not on file    Attends meetings of clubs or organizations: Not on file    Relationship status: Not on file  Other Topics Concern  . Not on file  Social History Narrative  . Not on file   No  family history on file. No Known Allergies Prior to Admission medications   Medication Sig Start Date End Date Taking? Authorizing Provider  atenolol (TENORMIN) 50 MG tablet Take 50 mg by mouth daily.   Yes [provider]  esomeprazole (NEXIUM) 20 MG capsule Take 20 mg by mouth every morning. Before  breakfast   Yes [provider]  Multiple Vitamin (MULTIVITAMIN WITH MINERALS) TABS tablet Take 1 tablet by mouth daily.   Yes [provider]  Omega-3 Fatty Acids (FISH OIL) 1200 MG CAPS Take 1 capsule by mouth 2 (two) times daily.   Yes [provider]  venlafaxine (EFFEXOR) 37.5 MG tablet Take 37.5 mg by mouth daily. Taking 1/2 tablet daily.   Yes [provider]  vitamin E (VITAMIN E) 1000 UNIT capsule Take 1,000 Units by mouth 2 (two) times daily.   Yes [provider]     Positive ROS: All other systems have been reviewed and were otherwise negative with the exception of those mentioned in the HPI and as above.  Physical Exam: General: Alert, no acute distress Cardiovascular: No pedal edema Respiratory: No cyanosis, no use of accessory musculature GI: No organomegaly, abdomen is soft and non-tender Skin: No lesions in the  area of chief complaint Neurologic: Sensation intact distally Psychiatric: Patient is competent for consent with normal mood and affect Lymphatic: No axillary or cervical lymphadenopathy  MUSCULOSKELETAL: Right knee has significant swelling, positive pain to palpation laterally more than medially, she has slight valgus alignment.  Compartments are soft, EHL and FHL are intact, sensation intact distally and she has intact dorsalis pedis pulse.  Assessment: Right posterior lateral tibial plateau fracture with likely avulsion of the ACL.   Plan: This is an acute severe injury and threatens long-term ambulatory function.  I discussed the options with her both surgical and nonsurgical.  The fracture location is too  far posterior for internal fixation, and the majority of her condyle is still intact.  I recommended a course of conservative management with knee immobilizer, touch toe weightbearing, and allowing the joint to heal.  This may be all that is necessary.  Alternatively, total knee replacement may be required in the future for pain control as well as alignment, depending on how she heals.  I discussed all of this with her.  She is currently not safe for discharge, and has not been capable of mobilizing, and pain is not adequately controlled.  We will plan for admission to observation, and discharge tomorrow if she has passed physical therapy and pain is under control.     Eulas Post, MD Cell [phone]   05/24/2018 8:03 AM

## 2018-05-24 NOTE — Discharge Instructions (Signed)
Displaced Tibial Plateau Fracture  A tibial plateau fracture is a break in the top of the shin bone (tibia). The top of the tibia has a flat, smooth surface (tibial plateau). This part of the tibia is softer than the rest of the bone. It forms the bottom of the knee joint. If a strong force shoves the thigh bone (femur) down and onto the tibial plateau, the tibial plateau can collapse or break apart at the edges. This may also be called an intra-articular fracture. A displaced fracture means that one or more pieces of the broken bone have moved out of their normal position. Without treatment, this fracture can make the knee unstable. It can also lead to joint stiffness (arthritis) or difficulty walking. What are the causes? Common causes of this type of fracture include:  Car accidents.  Jumps or falls from a significant height.  Injuries from activities that put a lot of force on the knee, such as injuries from skiing, mountain biking, or contact sports. What increases the risk? You may be at higher risk for this type of fracture if:  You play sports that put a lot of force on the knee, including contact sports.  You have a history of bone infections.  You are an older person with a condition that causes weak bones (osteoporosis). What are the signs or symptoms? Symptoms of a displaced tibial plateau fracture begin right after the injury. They may include:  Pain that gets worse when putting weight on the knee.  Knee swelling and bruising.  The knee having an abnormal shape (deformity).  The foot looking pale and feeling cool to the touch.  Having a feeling of pins and needles around the foot. How is this diagnosed? This condition may be diagnosed based on:  Your symptoms and medical history. Your health care provider may ask about recent knee or leg injuries you have had.  A physical exam.  X-rays.  CT scan. This may be done to: ? Identify how much the bone has moved out of  place. ? Find any broken-off pieces of bone. How is this treated? Treatment depends on how severe your fracture is and how the pieces of the broken bone line up with each other (alignment).  If the pieces of the broken bone are not too far apart, you will need to wear a knee brace for at least 8 weeks.  If the pieces of the broken bone are too far apart, you will need to have surgery. During surgery, pieces of broken bone will be moved back into position, and screws or other types of hardware will be used to hold the bone pieces in place (open reduction with internal fixation, ORIF). Follow these instructions at home: Medicines  Take over-the-counter and prescription medicines only as told by your health care provider.  Do not drive or use heavy machinery while taking prescription pain medicine.  If you are taking prescription pain medicine, take actions to prevent or treat constipation. Your health care provider may recommend that you: ? Drink enough fluid to keep your urine pale yellow. ? Eat foods that are high in fiber, such as fresh fruits and vegetables, whole grains, and beans. ? Limit foods that are high in fat and processed sugars, such as fried or sweet foods. ? Take an over-the-counter or prescription medicine for constipation. If you have a brace:  Wear the brace as told by your health care provider. Remove it only as told by your health care provider.  Loosen the brace if your toes tingle, become numb, or turn cold and blue.  Keep the brace clean.  If your brace is not waterproof: ? Do not let it get wet. ? Cover it with a watertight covering when you take a bath or a shower. Activity  Do not use your leg to support your body weight until your health care provider says that you can. Follow weight-bearing restrictions.  Use crutches, a cane, or a walker as directed.  Ask your health care provider what activities are safe for you and what activities you need to  avoid.  Do not drive until your health care provider approves. Managing pain, stiffness, and swelling   If directed, put ice on painful areas: ? If you have a removable brace, remove it as told by your health care provider. ? Put ice in a plastic bag. ? Place a towel between your skin and the bag. ? Leave the ice on for 20 minutes, 2-3 times a day.  Move your toes and ankle often to avoid stiffness and to lessen swelling.  Raise (elevate) the injured area above the level of your heart while you are sitting or lying down. General instructions  Do not take baths, swim, or use a hot tub until your health care provider approves. Ask your health care provider if you may take showers. You may only be allowed to take sponge baths.  Do not use any products that contain nicotine or tobacco, such as cigarettes and e-cigarettes. These can delay bone healing. If you need help quitting, ask your health care provider.  Keep all follow-up visits as told by your health care provider. This is important. Contact a health care provider if you have:  A fever or chills.  Pain that does not get better with medicine. Get help right away if you have:  Severe pain or swelling.  New pain, swelling, or warmth in your lower leg.  New numbness or cold feeling in your lower leg that does not go away when you loosen your brace.  Chest pain.  Difficulty breathing. Summary  A tibial plateau fracture is a break in the bone that forms the bottom of your knee joint (tibia or shin bone).  Common causes of this type of fracture include car accidents, jumps or falls from a significant height, and sports injuries. You are also more at risk if you are older and have a condition that causes weak bones (osteoporosis).  Treatment may require surgery if the pieces of bone are too far apart. This information is not intended to replace advice given to you by your health care provider. Make sure you discuss any  questions you have with your health care provider. Document Released: 01/21/2002 Document Revised: 06/19/2017 Document Reviewed: 06/19/2017 Elsevier Interactive Patient Education  2019 [employer]

## 2018-05-24 NOTE — Care Management Note (Signed)
Case Management Note  Patient Details  Name: MANVI GUILLIAMS MRN: [id number] Date of Birth: December 31, 1945  Subjective/Objective:  73 yo female presented after a twisting injury which resulted in increased pain with ambulation; dx of Closed fracture of lateral portion of right tibial plateau            Action/Plan: CM met with patient to discuss dispositional needs. Patient stated living at home with her son and was independent with ADLs PTA. PCP verified as: [facility]; physical address: [place], [place] [address]. CM consult acknowledged for HH/DME; awaiting PT/OT evals for recommendations. CM team will continue to follow.   Expected Discharge Date:  05/25/18               Expected Discharge Plan:  [place]  In-House Referral:  NA  Discharge planning Services  CM Consult  Post Acute Care Choice:  NA Choice offered to:  NA  DME Arranged:  N/A DME Agency:  NA  HH Arranged:  NA HH Agency:  NA  Status of Service:  In process, will continue to follow  If discussed at Long Length of Stay Meetings, dates discussed:    Additional Comments:  Midge Minium RN, BSN, NCM-BC, ACM-RN [phone] 05/24/2018, 2:45 PM

## 2018-05-24 NOTE — Care Management Obs Status (Signed)
MEDICARE OBSERVATION STATUS NOTIFICATION   Patient Details  Name: Heather Houston MRN: [id number] Date of Birth: 1946/03/17   Medicare Observation Status Notification Given:  Yes    Colleen Can RN, BSN, NCM-BC, ACM-RN [phone] 05/24/2018, 2:45 PM

## 2018-05-24 NOTE — Progress Notes (Signed)
Orthopedic Tech Progress Note Patient Details:  Heather Houston [birth year] [id number] Applied with [name] Ortho Devices Type of Ortho Device: Crutches, Knee Immobilizer Ortho Device/Splint Interventions: Adjustment, Application, Ordered   Post Interventions Patient Tolerated: Well Instructions Provided: Care of device, Adjustment of device   [name] 05/24/2018, 1:55 AM

## 2018-05-24 NOTE — ED Provider Notes (Signed)
[facility] Provider [facility]   CSN: [id number] Arrival date & time: 05/23/18  2303     History   Chief Complaint Chief Complaint  Patient presents with  . Fall  . Leg Pain    HPI Heather Houston is a 73 y.o. female.  HPI   73 yo F here with severe R knee pain. Pt states she was walking down bleachers today when she tripped, landing hard on her right knee then tumbling forward. She landed on her leg. Denies any head injury or LOC. She was <3 steps up. She reports immediate onset of severe, aching, throbbing R knee pain that has persisted since onset. She tried to put weight on her knee and it felt very unstable. She had to crawl to the door to ask for help. She reports that since then, she has been unable to put any weight or move her leg w/o significant pain. Denies any distal numbness or weakness. No other complaints. No other areas of pain. She is not on blood thinners.  History reviewed. No pertinent past medical history.  There are no active problems to display for this patient.   History reviewed. No pertinent surgical history.   OB History   No obstetric history on file.      Home Medications    Prior to Admission medications   Medication Sig Start Date End Date Taking? Authorizing Provider  atenolol (TENORMIN) 50 MG tablet Take 50 mg by mouth daily.   Yes [provider]  esomeprazole (NEXIUM) 20 MG capsule Take 20 mg by mouth every morning. Before  breakfast   Yes [provider]  Multiple Vitamin (MULTIVITAMIN WITH MINERALS) TABS tablet Take 1 tablet by mouth daily.   Yes [provider]  Omega-3 Fatty Acids (FISH OIL) 1200 MG CAPS Take 1 capsule by mouth 2 (two) times daily.   Yes [provider]  venlafaxine (EFFEXOR) 37.5 MG tablet Take 37.5 mg by mouth daily. Taking 1/2 tablet daily.   Yes [provider]  vitamin E (VITAMIN E) 1000 UNIT capsule Take 1,000 Units by mouth 2 (two)  times daily.   Yes [provider]    Family History No family history on file.  Social History Social History   Tobacco Use  . Smoking status: Never Smoker  . Smokeless tobacco: Never Used  Substance Use Topics  . Alcohol use: Not Currently    Frequency: Never  . Drug use: Never     Allergies   Patient has no known allergies.   Review of Systems Review of Systems  Constitutional: Negative for chills, fatigue and fever.  HENT: Negative for congestion and rhinorrhea.   Eyes: Negative for visual disturbance.  Respiratory: Negative for cough, shortness of breath and wheezing.   Cardiovascular: Negative for chest pain and leg swelling.  Gastrointestinal: Negative for abdominal pain, diarrhea, nausea and vomiting.  Genitourinary: Negative for dysuria and flank pain.  Musculoskeletal: Positive for arthralgias and gait problem. Negative for neck pain and neck stiffness.  Skin: Negative for rash and wound.  Allergic/Immunologic: Negative for immunocompromised state.  Neurological: Negative for syncope, weakness and headaches.  All other systems reviewed and are negative.    Physical Exam Updated Vital Signs BP (!) 124/52 (BP Location: Right Arm)   Pulse 60   Temp 97.7 F (36.5 C) (Oral)   Resp 16   SpO2 100%   Physical Exam Vitals signs and nursing note reviewed.  Constitutional:  General: She is not in acute distress.    Appearance: She is well-developed.  HENT:     Head: Normocephalic and atraumatic.     Comments: No apparent head or neck trauma Eyes:     Conjunctiva/sclera: Conjunctivae normal.  Neck:     Musculoskeletal: Neck supple.  Cardiovascular:     Rate and Rhythm: Normal rate and regular rhythm.     Heart sounds: Normal heart sounds.  Pulmonary:     Effort: Pulmonary effort is normal. No respiratory distress.     Breath sounds: No wheezing.  Abdominal:     General: There is no distension.  Skin:    General: Skin is warm.      Capillary Refill: Capillary refill takes less than 2 seconds.     Findings: No rash.  Neurological:     Mental Status: She is alert and oriented to person, place, and time.     Motor: No abnormal muscle tone.     LOWER EXTREMITY EXAM: RIGHT  INSPECTION & PALPATION: Marked TTP over proximal tibia and posterior knee, with moderate knee effusion. No open wounds.  SENSORY: sensation is intact to light touch in:  Superficial peroneal nerve distribution (over dorsum of foot) Deep peroneal nerve distribution (over first dorsal web space) Sural nerve distribution (over lateral aspect 5th metatarsal) Saphenous nerve distribution (over medial instep)  MOTOR:  + Motor EHL (great toe dorsiflexion) + FHL (great toe plantar flexion)  + TA (ankle dorsiflexion)  + GSC (ankle plantar flexion)  VASCULAR: 2+ dorsalis pedis and posterior tibialis pulses Capillary refill < 2 sec, toes warm and well-perfused  COMPARTMENTS: Soft, warm, well-perfused No pain with passive extension No parethesias    ED Treatments / Results  Labs (all labs ordered are listed, but only abnormal results are displayed) Labs Reviewed  CBC WITH DIFFERENTIAL/PLATELET - Abnormal; Notable for the following components:      Result Value   WBC 12.3 (*)    Neutro Abs 8.4 (*)    All other components within normal limits  BASIC METABOLIC PANEL - Abnormal; Notable for the following components:   Glucose, Bld 147 (*)    Calcium 8.8 (*)    All other components within normal limits    EKG None  Radiology Ct Knee Right Wo Contrast  Result Date: 05/24/2018 CLINICAL DATA:  Fall with knee pain EXAM: CT OF THE right KNEE WITHOUT CONTRAST TECHNIQUE: Multidetector CT imaging of the right knee was performed according to the standard protocol. Multiplanar CT image reconstructions were also generated. COMPARISON:  Radiograph 05/23/2002 FINDINGS: Bones/Joint/Cartilage Acute, mildly comminuted and displaced fracture involving the  posterior lateral articular surface of the proximal tibia with about 6 mm of depression. Intra-articular avulsion fracture involving the anterior articular surface of the tibia in the region of ACL insertion. Small bone fragments between the tibial spines. Focal sclerosis in the medial tibial plateau likely a bone island. Sub articular cysts within the upper patella. Moderate hemarthrosis of the knee. Ligaments Suboptimally assessed by CT. Muscles and Tendons Minimal muscular atrophy. Quadriceps tendon and patellar tendon appear intact. Soft tissues Soft tissue edema at the knee.  14 mm medial popliteal fossa cyst. IMPRESSION: 1. Acute, slightly comminuted fracture involving the posterolateral plateau of the proximal tibia with about 6 mm depression. 2. Acute intra-articular avulsion fracture injury involving the anterior articular surface of the tibia in the region of ACL insertion. Several small osseous fragment/loose bodies within the central joint. 3. Moderate hemarthrosis Electronically Signed   By:  Jasmine Pang M.D.   On: 05/24/2018 02:53   Dg Knee Complete 4 Views Right  Result Date: 05/23/2018 CLINICAL DATA:  Trip and fall to the right knee. Generalized knee pain with difficulty flexing and extending. EXAM: RIGHT KNEE - COMPLETE 4+ VIEW COMPARISON:  05/03/2018 FINDINGS: Degenerative changes in the right knee with medial and lateral compartment narrowing and small osteophyte formation. Cartilage calcification in the lateral compartment. Small osseous fragments demonstrated in the medial compartment suggesting loose bodies. These were not seen previously, or possibly representing acute avulsion fragments from the tibial spines. Slight irregularity along the lateral tibial plateau may represent mild depression fracture. Moderate right knee effusion with increased density possibly representing hemorrhagic effusion. IMPRESSION: Degenerative changes in the right knee. Small osseous fragments demonstrated in  the medial compartment suggesting loose bodies, possibly acute. Possible lateral tibial plateau fracture. Right knee effusion, probably hemorrhagic. Electronically Signed   By: Burman Nieves M.D.   On: 05/23/2018 23:44    Procedures Procedures (including critical care time)  Medications Ordered in ED Medications  oxyCODONE-acetaminophen (PERCOCET/ROXICET) 5-325 MG per tablet 1 tablet (1 tablet Oral Given 05/23/18 2318)  ondansetron (ZOFRAN) injection 4 mg (4 mg Intravenous Given 05/24/18 0218)  fentaNYL (SUBLIMAZE) injection 50 mcg (50 mcg Intravenous Given 05/24/18 0219)  HYDROmorphone (DILAUDID) injection 0.5 mg (0.5 mg Intravenous Given 05/24/18 0415)  0.9 %  sodium chloride infusion ( Intravenous New Bag/Given 05/24/18 0415)     Initial Impression / Assessment and Plan / ED Course  I have reviewed the triage vital signs and the nursing notes.  Pertinent labs & imaging results that were available during my care of the patient were reviewed by me and considered in my medical decision making (see chart for details).     73 yo F here with R knee pain after mechanical fall. Unable to put any weight on leg. CT scan shows posterolateral tibial plateau fx. Distal NV is intact. No open wounds. Compartments are soft. Suspect she also has an ACL injury. Discussed with Dr. Dion Saucier. Attempts made to ambulate with knee immobilizer but pt unable to tolerate, is hesitant to manage at home. Dr. Dion Saucier to see this AM for evaluation, admission for PT/OT. Given pt's otherwise healthy baseline status, no other comorbidities, feel this is reasonable. Denies head injury or other areas of pain. Knee immobilizer placed by Ortho tech.  Final Clinical Impressions(s) / ED Diagnoses   Final diagnoses:  Closed fracture of right tibial plateau, initial encounter  Fall, initial encounter    ED Discharge Orders    None       Shaune Pollack, MD 05/24/18 [phone]

## 2018-05-25 DIAGNOSIS — Z79899 Other long term (current) drug therapy: Secondary | ICD-10-CM | POA: Diagnosis not present

## 2018-05-25 DIAGNOSIS — I10 Essential (primary) hypertension: Secondary | ICD-10-CM | POA: Diagnosis present

## 2018-05-25 DIAGNOSIS — W1789XA Other fall from one level to another, initial encounter: Secondary | ICD-10-CM | POA: Diagnosis present

## 2018-05-25 DIAGNOSIS — S82141A Displaced bicondylar fracture of right tibia, initial encounter for closed fracture: Secondary | ICD-10-CM | POA: Diagnosis present

## 2018-05-25 DIAGNOSIS — Y9289 Other specified places as the place of occurrence of the external cause: Secondary | ICD-10-CM | POA: Diagnosis not present

## 2018-05-25 DIAGNOSIS — F419 Anxiety disorder, unspecified: Secondary | ICD-10-CM | POA: Diagnosis present

## 2018-05-25 DIAGNOSIS — M25561 Pain in right knee: Secondary | ICD-10-CM | POA: Diagnosis present

## 2018-05-25 DIAGNOSIS — E785 Hyperlipidemia, unspecified: Secondary | ICD-10-CM | POA: Diagnosis present

## 2018-05-25 NOTE — Progress Notes (Signed)
Physical Therapy Treatment Patient Details Name: Heather Houston MRN: [id number] DOB: 27-Feb-1946 Today's Date: 05/25/2018    History of Present Illness Admit with Right posterior lateral tibial plateau fracture with likely avulsion of the ACL.  MD wants to have conservative managment with knee immobilizer.      PT Comments    Patient seen for mobility progression. Pt is making progress toward PT goals and tolerated short distance gait training with RW. Overall pt requires min guard/min A for OOB mobility. Continue to progress as tolerated.    Follow Up Recommendations  SNF(has no 24 hour care; if home is d/c plan, HHPT,HHOT, HHAide )     Equipment Recommendations  Rolling walker with 5" wheels;3in1 (PT);Wheelchair (measurements PT);Wheelchair cushion (measurements PT)    Recommendations for Other Services       Precautions / Restrictions Precautions Precautions: Fall;Knee Required Braces or Orthoses: Knee Immobilizer - Right Knee Immobilizer - Right: On at all times Restrictions Weight Bearing Restrictions: Yes RLE Weight Bearing: Touchdown weight bearing    Mobility  Bed Mobility Overal bed mobility: Needs Assistance Bed Mobility: Supine to Sit     Supine to sit: Min assist     General bed mobility comments: Assisted to move right LE to EOB.   Transfers Overall transfer level: Needs assistance Equipment used: Rolling walker (2 wheeled) Transfers: Sit to/from Stand Sit to Stand: Min guard         General transfer comment: min guard for safety; cues for hand placement  Ambulation/Gait Ambulation/Gait assistance: Min assist Gait Distance (Feet): 25 Feet Assistive device: Rolling walker (2 wheeled) Gait Pattern/deviations: Step-to pattern Gait velocity: decreased   General Gait Details: pt maintained NWB R LE; cues for proximity to RW; grossly steady without LOB   Stairs             Wheelchair Mobility    Modified Rankin (Stroke Patients Only)        Balance Overall balance assessment: Needs assistance Sitting-balance support: Feet supported;No upper extremity supported Sitting balance-Leahy Scale: Good     Standing balance support: Bilateral upper extremity supported;During functional activity Standing balance-Leahy Scale: Poor                              Cognition Arousal/Alertness: Awake/alert Behavior During Therapy: WFL for tasks assessed/performed Overall Cognitive Status: Within Functional Limits for tasks assessed                                        Exercises      General Comments        Pertinent Vitals/Pain Pain Assessment: Faces Faces Pain Scale: Hurts little more Pain Location: Right LE in dependent position Pain Descriptors / Indicators: Sore Pain Intervention(s): Limited activity within patient's tolerance;Monitored during session;Repositioned    Home Living                      Prior Function            PT Goals (current goals can now be found in the care plan section) Acute Rehab PT Goals Patient Stated Goal: to go for rehab Progress towards PT goals: Progressing toward goals    Frequency    Min 6X/week      PT Plan Current plan remains appropriate    Co-evaluation  AM-PAC PT "6 Clicks" Mobility   Outcome Measure  Help needed turning from your back to your side while in a flat bed without using bedrails?: None Help needed moving from lying on your back to sitting on the side of a flat bed without using bedrails?: A Little Help needed moving to and from a bed to a chair (including a wheelchair)?: A Little Help needed standing up from a chair using your arms (e.g., wheelchair or bedside chair)?: A Little Help needed to walk in hospital room?: A Little Help needed climbing 3-5 steps with a railing? : A Lot 6 Click Score: 18    End of Session Equipment Utilized During Treatment: Gait belt;Right knee  immobilizer Activity Tolerance: Patient tolerated treatment well Patient left: in chair;with call bell/phone within reach Nurse Communication: Mobility status PT Visit Diagnosis: Unsteadiness on feet (R26.81);Muscle weakness (generalized) (M62.81);Pain Pain - Right/Left: Right Pain - part of body: Knee     Time: 1150-1203 PT Time Calculation (min) (ACUTE ONLY): 13 min  Charges:  $Gait Training: 8-22 mins                     Erline LevineKellyn Badr Piedra, PTA Acute Rehabilitation Services Pager: [phone] Office: [phone]     Carolynne EdouardKellyn R Dontrelle Mazon 05/25/2018, 1:01 PM

## 2018-05-25 NOTE — Progress Notes (Addendum)
Subjective: Patient hospitalized of pain control and mobilization following right tibial plateau fracture.   Objective: Vital signs in last 24 hours: Temp:  [98.2 F (36.8 C)-98.6 F (37 C)] 98.2 F (36.8 C) (01/11 0626) Pulse Rate:  [58-76] 68 (01/11 0831) Resp:  [15-25] 16 (01/11 0626) BP: (109-146)/(48-68) 118/62 (01/11 0831) SpO2:  [94 %-100 %] 94 % (01/11 0626) Weight:  [80.2 kg] 80.2 kg (01/10 1423)  Intake/Output from previous day: 01/10 0701 - 01/11 0700 In: 1864.2 [P.O.:480; I.V.:1334.2; IV Piggyback:50] Out: 301 [Urine:300; Stool:1] Intake/Output this shift: No intake/output data recorded.  Recent Labs    05/24/18 0156 05/24/18 0928  HGB 12.8 13.0   Recent Labs    05/24/18 0156 05/24/18 0928  WBC 12.3* 9.3  RBC 4.54 4.44  HCT 42.0 42.1  PLT 227 209   Recent Labs    05/24/18 0156 05/24/18 0928  NA 139  --   K 3.5  --   CL 106  --   CO2 24  --   BUN 16  --   CREATININE 0.86 0.96  GLUCOSE 147*  --   CALCIUM 8.8*  --    No results for input(s): LABPT, INR in the last 72 hours.     Assessment/Plan: Principal Problem:   Closed fracture of lateral portion of right tibial plateau  Touchdown weight bearing.  Patient will need walker and home PT and OT to facilitate mobility.  Can D/c to home to day if progressing well with physical therapy   Heather Houston 05/25/2018, 9:17 AM    ADDENDUM: Patient ambulated with physical therapy for pivot transfers but cannot maintain touchdown weight bearing for any distance.  Social work and patient and family have had long discussions.  She is currently being changed from Outpatient observation to Inpatient needed placement in a short term SNF as she is unsafe to discharge home with limited mobility and no 24 hr care available.     Heather Houston Physician Assistant [facility] [phone]  05/25/2018, 12:34 PM

## 2018-05-25 NOTE — Progress Notes (Signed)
Pt IV fell out during night (1/10). RN notified PA. PA states pt does not need an IV at this time. Verbal order to discontinue continuous fluids.

## 2018-05-25 NOTE — Progress Notes (Signed)
Pt transferring to 5N24 and report called off to [employer]. Pt transported off unit via bed with belongings to the side. Dionne Bucy RN

## 2018-05-25 NOTE — Progress Notes (Signed)
RN spoke with CM. CM states she will placed orders for front wheel walker.

## 2018-05-25 NOTE — Clinical Social Work Note (Signed)
Clinical Social Work Assessment  Patient Details  Name: Heather Houston MRN: [id number] Date of Birth: 19-Dec-1945  Date of referral:  05/25/18               Reason for consult:  Facility Placement                Permission sought to share information with:  Family Supports Permission granted to share information::  Yes, Verbal Permission Granted  Name::        Agency::     Relationship::     Contact Information:     Housing/Transportation Living arrangements for the past 2 months:  Single Family Home Source of Information:  Patient Patient Interpreter Needed:  None Criminal Activity/Legal Involvement Pertinent to Current Situation/Hospitalization:  No - Comment as needed Significant Relationships:  Adult Children Lives with:  Adult Children Do you feel safe going back to the place where you live?  No(Son is not at home much and patient needs 24/hr supervision) Need for family participation in patient care:  Yes (Comment)  Care giving concerns:  Patient has limited support in the home as son is often not around.    Social Worker assessment / plan:  CSW will continue with SNF referral and work up. CSW concerned regarding being unable to reach [facility] for waiver eligibility. CSW informed patient who verbalized understanding.  Employment status:  Retired Database administrator PT Recommendations:  Skilled Nursing Facility, 24 Hour Supervision Information / Referral to community resources:     Patient/Family's Response to care:  Patient was adamant no home health and advocated for SNF placement with [facility] if possible.   Patient/Family's Understanding of and Emotional Response to Diagnosis, Current Treatment, and Prognosis:  CSW left v/m with family. Patient was open to SNF but was somewhat guarded throughout the assessment. CSW noted patient was advocating for SNF and not home health at this time.  Emotional Assessment Appearance:  Appears stated  age Attitude/Demeanor/Rapport:  Guarded Affect (typically observed):  Apprehensive, Appropriate, Quiet Orientation:  Oriented to Place, Oriented to Situation, Oriented to  Time, Oriented to Self Alcohol / Substance use:  Not Applicable Psych involvement (Current and /or in the community):  No (Comment)  Discharge Needs  Concerns to be addressed:  Financial / Insurance Concerns([facility] waiver eligible with managed Medicare?) Readmission within the last 30 days:  No Current discharge risk:  None Barriers to Discharge:  Insurance Authorization   Annalee Genta, LCSW 05/25/2018, 10:45 AM

## 2018-05-25 NOTE — Progress Notes (Addendum)
12:45PM: CSW spoke with patient's other son Nathaneil Canary. CSW was informed by PA that patient was being transferred to inpatient and a disposition will be made on Monday. CSW will continue to follow.  12:05PM: CSW spoke with patient's son Annalysa Mohammad. Who reported that he and his two other brothers were planning on being home with the patient to help take care of her. CSW attempted to reach [facility] regarding home health and will continue to reach out.  11:45AM: CSW spoke with PA regarding plans for patient. CSW attempted to call both of patient's sons and left voicemails. CSW will continue to follow to determine patient discharge planning.   CSW met with patient to discuss SNF. CSW informed patient he is unable to verify waiver eligibility with managed Medicare as [facility] was only available on Weekdays. CSW noted patient affirmed she needed a SNF as there was only a son at home who is rarely present. CSW noted patient's preference for [facility] SNF and secondary would be the waiver SNF's in [place]. CSW informed patient it may likely be difficult for her to be placed in a SNF today and noted patient's understanding. CSW informed RN. CSW will continue to follow for placement.  Lamonte Richer, LCSW, [place] Worker II [phone]

## 2018-05-25 NOTE — Evaluation (Signed)
Occupational Therapy Evaluation Patient Details Name: Heather ScotlandRuther L Meda MRN: [id number] DOB: 05-14-1946 Today's Date: 05/25/2018    History of Present Illness Admit with Right posterior lateral tibial plateau fracture with likely avulsion of the ACL.  MD wants to have conservative managment with knee immobilizer.     Clinical Impression   Pt admitted with above. She demonstrates the below listed deficits and will benefit from continued OT to maximize safety and independence with BADLs.  Pt presents to OT with decreased activity tolerance, impaired balance, pain.  She currently requires max A for LB ADLs and min A for functional mobility.  She lives with her son, who works during the day.  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  Recommend SNF level rehab to allow her to regain independence with ADLs and reduce risk of falls.       Follow Up Recommendations  SNF    Equipment Recommendations  None recommended by OT    Recommendations for Other Services       Precautions / Restrictions Precautions Precautions: Fall;Knee Required Braces or Orthoses: Knee Immobilizer - Right Knee Immobilizer - Right: On at all times Restrictions Weight Bearing Restrictions: Yes RLE Weight Bearing: Touchdown weight bearing      Mobility Bed Mobility Overal bed mobility: Needs Assistance Bed Mobility: Supine to Sit     Supine to sit: Min assist     General bed mobility comments: Assisted to move right LE to EOB.   Transfers Overall transfer level: Needs assistance Equipment used: Rolling walker (2 wheeled) Transfers: Sit to/from Stand Sit to Stand: Min guard Stand pivot transfers: Min assist       General transfer comment: assist for balance     Balance Overall balance assessment: Needs assistance Sitting-balance support: Feet supported;No upper extremity supported Sitting balance-Leahy Scale: Good     Standing balance support: Bilateral  upper extremity supported;During functional activity Standing balance-Leahy Scale: Poor                             ADL either performed or assessed with clinical judgement   ADL Overall ADL's : Needs assistance/impaired Eating/Feeding: Independent   Grooming: Wash/dry hands;Wash/dry face;Oral care;Brushing hair;Minimal assistance;Standing Grooming Details (indicate cue type and reason): min A for standing balance  Upper Body Bathing: Set up;Sitting   Lower Body Bathing: Maximal assistance;Sit to/from stand   Upper Body Dressing : Set up;Supervision/safety;Sitting   Lower Body Dressing: Maximal assistance;Sit to/from stand   Toilet Transfer: Minimal assistance;Stand-pivot;BSC;RW   Toileting- Clothing Manipulation and Hygiene: Moderate assistance;Sit to/from stand       Functional mobility during ADLs: Minimal assistance;Rolling walker General ADL Comments: Pt unable to access Rt foot for LB ADLs and requires assist for balance      Vision         Perception     Praxis      Pertinent Vitals/Pain Pain Assessment: Faces Faces Pain Scale: Hurts little more Pain Location: Right LE in dependent position Pain Descriptors / Indicators: Sore Pain Intervention(s): Monitored during session     Hand Dominance Right   Extremity/Trunk Assessment Upper Extremity Assessment Upper Extremity Assessment: Overall WFL for tasks assessed   Lower Extremity Assessment Lower Extremity Assessment: Defer to PT evaluation   Cervical / Trunk Assessment Cervical / Trunk Assessment: Normal   Communication Communication Communication: No difficulties   Cognition Arousal/Alertness: Awake/alert Behavior During Therapy: WFL for tasks assessed/performed  Overall Cognitive Status: Within Functional Limits for tasks assessed                                     General Comments       Exercises     Shoulder Instructions      Home Living Family/patient  expects to be discharged to:: Private residence Living Arrangements: Children Available Help at Discharge: Family;Available PRN/intermittently Type of Home: House Home Access: Stairs to enter Entergy CorporationEntrance Stairs-Number of Steps: 2 Entrance Stairs-Rails: Right;Left;Can reach both Home Layout: One level     Bathroom Shower/Tub: Chief Strategy OfficerTub/shower unit   Bathroom Toilet: Handicapped height     Home Equipment: Crutches   Additional Comments: so works during the day       Prior Functioning/Environment Level of Independence: Independent        Comments: 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         OT Problem List: Decreased activity tolerance;Impaired balance (sitting and/or standing);Decreased knowledge of use of DME or AE;Decreased knowledge of precautions;Pain      OT Treatment/Interventions: Self-care/ADL training;Therapeutic exercise;Therapeutic activities;Patient/family education;Balance training    OT Goals(Current goals can be found in the care plan section) Acute Rehab OT Goals Patient Stated Goal: I've got to get better so I can go back to work  OT Goal Formulation: With patient Time For Goal Achievement: 06/08/18 Potential to Achieve Goals: Good ADL Goals Pt Will Perform Grooming: with supervision;standing Pt Will Perform Lower Body Bathing: with supervision;with adaptive equipment;sit to/from stand Pt Will Perform Lower Body Dressing: with supervision;sit to/from stand;with adaptive equipment Pt Will Transfer to Toilet: with supervision;ambulating;regular height toilet;bedside commode;grab bars Pt Will Perform Toileting - Clothing Manipulation and hygiene: with supervision;sit to/from stand  OT Frequency: Min 2X/week   Barriers to D/C: Decreased caregiver support          Co-evaluation              AM-PAC OT "6 Clicks" Daily Activity     Outcome Measure Help from another person eating meals?: None Help from another person taking care of personal  grooming?: A Little Help from another person toileting, which includes using toliet, bedpan, or urinal?: A Little Help from another person bathing (including washing, rinsing, drying)?: A Lot Help from another person to put on and taking off regular upper body clothing?: A Little Help from another person to put on and taking off regular lower body clothing?: A Lot 6 Click Score: 17   End of Session Equipment Utilized During Treatment: Gait belt;Rolling walker;Right knee immobilizer Nurse Communication: Mobility status  Activity Tolerance: Patient tolerated treatment well Patient left: in chair;with call bell/phone within reach;with nursing/sitter in room  OT Visit Diagnosis: Unsteadiness on feet (R26.81);Pain Pain - Right/Left: Right Pain - part of body: Leg                Time: 1207-1224 OT Time Calculation (min): 17 min Charges:  OT General Charges $OT Visit: 1 Visit OT Evaluation $OT Eval Moderate Complexity: 1 Mod  Jeani HawkingWendi Harshaan Whang, OTR/L Acute Rehabilitation Services Pager [phone] Office [phone]   Jeani HawkingConarpe, Tyona Nilsen M 05/25/2018, 2:05 PM

## 2018-05-26 NOTE — Progress Notes (Addendum)
Subjective: Patient hurting less today  Objective: Vital signs in last 24 hours: Temp:  [97.7 F (36.5 C)-98.6 F (37 C)] 97.7 F (36.5 C) (01/12 1507) Pulse Rate:  [57-78] 72 (01/12 1507) Resp:  [14] 14 (01/11 2014) BP: (109-117)/(46-67) 109/46 (01/12 1507) SpO2:  [97 %-100 %] 97 % (01/12 1507)  Intake/Output from previous day: 01/11 0701 - 01/12 0700 In: 480 [P.O.:480] Out: -  Intake/Output this shift: Total I/O In: 360 [P.O.:360] Out: -   Recent Labs    05/24/18 0156 05/24/18 0928  HGB 12.8 13.0   Recent Labs    05/24/18 0156 05/24/18 0928  WBC 12.3* 9.3  RBC 4.54 4.44  HCT 42.0 42.1  PLT 227 209   Recent Labs    05/24/18 0156 05/24/18 0928  NA 139  --   K 3.5  --   CL 106  --   CO2 24  --   BUN 16  --   CREATININE 0.86 0.96  GLUCOSE 147*  --   CALCIUM 8.8*  --    No results for input(s): LABPT, INR in the last 72 hours. Knee immobilizer in place   Able to to SLR without difficulty   Starting to mobilize    Assessment/Plan: Principal Problem:   Closed fracture of lateral portion of right tibial plateau  Patient has been kept in the hospital due to being unsafe to discharge.   Family at bedside now.  Heather Houston 05/26/2018, 3:15 PM   Agree with above.

## 2018-05-26 NOTE — NC FL2 (Signed)
[place] MEDICAID FL2 LEVEL OF CARE SCREENING TOOL     IDENTIFICATION  Patient Name: Heather Houston Birthdate: 20-Sep-1945 Sex: female Admission Date (Current Location): 05/23/2018  [facility] and [place] Number:  Producer, television/film/video and Address:  [facility], [address], [place], [place] [address]      Provider Number: [id number]  Attending Physician Name and Address:  Teryl Lucy, MD  Relative Name and Phone Number:       Current Level of Care: Hospital Recommended Level of Care: Skilled Nursing Facility Prior Approval Number:    Date Approved/Denied:   PASRR Number: [id number]  Discharge Plan: SNF    Current Diagnoses: Patient Active Problem List   Diagnosis Date Noted  . Closed fracture of lateral portion of right tibial plateau 05/24/2018    Orientation RESPIRATION BLADDER Height & Weight     Time, Situation, Place, Self  Normal Continent Weight: 176 lb 12.9 oz (80.2 kg) Height:  5\' 4"  (162.6 cm)  BEHAVIORAL SYMPTOMS/MOOD NEUROLOGICAL BOWEL NUTRITION STATUS      Continent Diet(heart healthy, thin liquids)  AMBULATORY STATUS COMMUNICATION OF NEEDS Skin   Limited Assist Verbally Normal                       Personal Care Assistance Level of Assistance  Bathing, Feeding, Dressing Bathing Assistance: Limited assistance Feeding assistance: Independent Dressing Assistance: Limited assistance     Functional Limitations Info  Sight, Speech, Hearing Sight Info: Adequate Hearing Info: Adequate Speech Info: Adequate    SPECIAL CARE FACTORS FREQUENCY  OT (By licensed OT), PT (By licensed PT)     PT Frequency: 6x OT Frequency: 6x            Contractures Contractures Info: Not present    Additional Factors Info  Code Status, Allergies Code Status Info: Full Code Allergies Info: No known allergies           Current Medications (05/26/2018):  This is the current hospital active medication list Current  Facility-Administered Medications  Medication Dose Route Frequency Provider Last Rate Last Dose  . acetaminophen (TYLENOL) tablet 650 mg  650 mg Oral Q6H PRN Teryl Lucy, MD       Or  . acetaminophen (TYLENOL) suppository 650 mg  650 mg Rectal Q6H PRN Teryl Lucy, MD      . acetaminophen (TYLENOL) tablet 650 mg  650 mg Oral Q6H Teryl Lucy, MD   650 mg at 05/26/18 1119  . atenolol (TENORMIN) tablet 50 mg  50 mg Oral Daily Teryl Lucy, MD   50 mg at 05/26/18 1057  . bisacodyl (DULCOLAX) suppository 10 mg  10 mg Rectal Daily PRN Teryl Lucy, MD      . diphenhydrAMINE (BENADRYL) 12.5 MG/5ML elixir 12.5-25 mg  12.5-25 mg Oral Q4H PRN Teryl Lucy, MD      . docusate sodium (COLACE) capsule 100 mg  100 mg Oral BID Teryl Lucy, MD   100 mg at 05/26/18 1058  . enoxaparin (LOVENOX) injection 40 mg  40 mg Subcutaneous Q24H Teryl Lucy, MD   40 mg at 05/26/18 1055  . HYDROcodone-acetaminophen (NORCO) 10-325 MG per tablet 1-2 tablet  1-2 tablet Oral Q4H PRN Teryl Lucy, MD   2 tablet at 05/25/18 1836  . HYDROmorphone (DILAUDID) injection 0.5 mg  0.5 mg Intravenous Q2H PRN Teryl Lucy, MD   0.5 mg at 05/24/18 1952  . methocarbamol (ROBAXIN) tablet 500 mg  500 mg Oral Q6H PRN  Teryl Lucy, MD   500 mg at 05/25/18 1324   Or  . methocarbamol (ROBAXIN) 500 mg in dextrose 5 % 50 mL IVPB  500 mg Intravenous Q6H PRN Teryl Lucy, MD   Stopped at 05/24/18 1101  . multivitamin with minerals tablet 1 tablet  1 tablet Oral Daily Teryl Lucy, MD   1 tablet at 05/26/18 1058  . omega-3 acid ethyl esters (LOVAZA) capsule 1 g  1 g Oral Daily Teryl Lucy, MD   1 g at 05/25/18 5625  . ondansetron (ZOFRAN) tablet 4 mg  4 mg Oral Q6H PRN Teryl Lucy, MD       Or  . ondansetron [facility]) injection 4 mg  4 mg Intravenous Q6H PRN Teryl Lucy, MD   4 mg at 05/24/18 1952  . oxyCODONE-acetaminophen (PERCOCET/ROXICET) 5-325 MG per tablet 1 tablet  1 tablet Oral Q30 min PRN Shaune Pollack,  MD   1 tablet at 05/23/18 2318  . pantoprazole (PROTONIX) EC tablet 80 mg  80 mg Oral Q1200 Teryl Lucy, MD   80 mg at 05/25/18 1317  . senna (SENOKOT) tablet 8.6 mg  1 tablet Oral BID Teryl Lucy, MD   8.6 mg at 05/26/18 1058  . senna-docusate (Senokot-S) tablet 1 tablet  1 tablet Oral QHS PRN Teryl Lucy, MD      . sodium phosphate (FLEET) 7-19 GM/118ML enema 1 enema  1 enema Rectal Once PRN Teryl Lucy, MD      . venlafaxine [facility]) tablet 18.75 mg  18.75 mg Oral Q breakfast Teryl Lucy, MD   18.75 mg at 05/26/18 1058  . zolpidem (AMBIEN) tablet 5 mg  5 mg Oral QHS PRN Teryl Lucy, MD         Discharge Medications: Please see discharge summary for a list of discharge medications.  Relevant Imaging Results:  Relevant Lab Results:   Additional Information SSN: [id number]  Maree Krabbe, LCSW

## 2018-05-27 NOTE — Progress Notes (Addendum)
Occupational Therapy Treatment Patient Details Name: Heather Houston MRN: [id number] DOB: 12-28-1945 Today's Date: 05/27/2018    History of present illness Admit with Right posterior lateral tibial plateau fracture with likely avulsion of the ACL.  MD wants to have conservative managment with knee immobilizer.     OT comments  Pt progressing toward OT goals. Pt reports difficulty with LB dressing of the RLE extremity. Pt educated on use of sock aid; Pt demonstrated use of sock aide with VC to pull strings instead moving LE. Pt demonstrated improved sit to stand sequence from chair with RW to engage in toileting and ADLs at the sink. Supervision required for safety to ensure proper technique to use RW with TDWB. Pt required verbal prompt for reminder to use during dynamic standing while turning. Pt will benefit from progression of goals in next setting; SNF due to lack of 24 hour care to ensure safe transition to home setting. Next session to address tub transfer with use of AE.   Follow Up Recommendations  SNF    Equipment Recommendations  None recommended by OT    Recommendations for Other Services      Precautions / Restrictions Precautions Precautions: Fall;Knee Required Braces or Orthoses: Knee Immobilizer - Right Knee Immobilizer - Right: On at all times Restrictions Weight Bearing Restrictions: Yes RLE Weight Bearing: Touchdown weight bearing       Mobility Bed Mobility                  Transfers Overall transfer level: Needs assistance Equipment used: Rolling walker (2 wheeled) Transfers: Sit to/from Stand Sit to Stand: Supervision         General transfer comment: supervision for safety; demonstrate good sequence in and safe strategies when performing transfers.     Balance Overall balance assessment: Needs assistance Sitting-balance support: Feet supported;No upper extremity supported Sitting balance-Leahy Scale: Good     Standing balance support:  Bilateral upper extremity supported;During functional activity Standing balance-Leahy Scale: Fair Standing balance comment: Relies on UE support for balance on RW, Pt is TDWB but walks no weight on the operated RLE.                             ADL either performed or assessed with clinical judgement   ADL Overall ADL's : Needs assistance/impaired Eating/Feeding: Independent   Grooming: Wash/dry hands;Supervision/safety                   Toilet Transfer: Hydrographic surveyor Details (indicate cue type and reason): Min guard with RW. Independent with sequence and hand placement Toileting- Clothing Manipulation and Hygiene: Independent       Functional mobility during ADLs: Rolling walker;Min guard General ADL Comments: Pt demonstrates improvement in static and dynamic standing with use of RW to engage in transfers and ADLs at the sink.      Vision       Perception     Praxis      Cognition Arousal/Alertness: Awake/alert Behavior During Therapy: WFL for tasks assessed/performed Overall Cognitive Status: Within Functional Limits for tasks assessed                                          Exercises     Shoulder Instructions       General Comments  Pertinent Vitals/ Pain       Pain Assessment: 0-10 Pain Score: 6  Pain Location: R LE Pain Descriptors / Indicators: Sore Pain Intervention(s): Monitored during session;Repositioned  Home Living                                          Prior Functioning/Environment              Frequency  Min 2X/week        Progress Toward Goals  OT Goals(current goals can now be found in the care plan section)  Progress towards OT goals: Progressing toward goals  Acute Rehab OT Goals Patient Stated Goal: I've got to get better so I can go back to work  OT Goal Formulation: With patient Time For Goal Achievement: 06/08/18 Potential to Achieve Goals: Good   Plan Discharge plan remains appropriate    Co-evaluation                 AM-PAC OT "6 Clicks" Daily Activity     Outcome Measure   Help from another person eating meals?: None Help from another person taking care of personal grooming?: A Little Help from another person toileting, which includes using toliet, bedpan, or urinal?: None Help from another person bathing (including washing, rinsing, drying)?: A Little Help from another person to put on and taking off regular upper body clothing?: A Little Help from another person to put on and taking off regular lower body clothing?: A Little 6 Click Score: 20    End of Session Equipment Utilized During Treatment: Right knee immobilizer;Rolling walker;Gait belt  OT Visit Diagnosis: Unsteadiness on feet (R26.81) Pain - Right/Left: Right Pain - part of body: Leg   Activity Tolerance Patient tolerated treatment well   Patient Left in chair;with call bell/phone within reach   Nurse Communication Mobility status;Weight bearing status        Time: 4315-4008 OT Time Calculation (min): 19 min  Charges: OT General Charges $OT Visit: 1 Visit OT Treatments $Self Care/Home Management : 8-22 mins  Marquette Old, MSOT, OTR/L  Supplemental Rehabilitation Services  [phone]   Zigmund Daniel 05/27/2018, 2:12 PM

## 2018-05-27 NOTE — Progress Notes (Signed)
Physical Therapy Treatment Patient Details Name: Heather Houston MRN: [id number] DOB: 1945/07/14 Today's Date: 05/27/2018    History of Present Illness Admit with Right posterior lateral tibial plateau fracture with likely avulsion of the ACL.  MD wants to have conservative managment with knee immobilizer.      PT Comments    Patient seen for mobility progression. Pt is mod I for bed mobility, supervision for sit to stand transfers, and min guard/min A for gait training of 35 ft this session.  Pt tolerated therex well. Continue to progress as tolerated.   Follow Up Recommendations  SNF(has no 24 hour care; if home is d/c plan, HHPT,HHOT, HHAide )     Equipment Recommendations  Rolling walker with 5" wheels;3in1 (PT);Wheelchair (measurements PT);Wheelchair cushion (measurements PT)    Recommendations for Other Services       Precautions / Restrictions Precautions Precautions: Fall;Knee Required Braces or Orthoses: Knee Immobilizer - Right Knee Immobilizer - Right: On at all times Restrictions Weight Bearing Restrictions: Yes RLE Weight Bearing: Touchdown weight bearing    Mobility  Bed Mobility Overal bed mobility: Modified Independent Bed Mobility: Supine to Sit              Transfers Overall transfer level: Needs assistance Equipment used: Rolling walker (2 wheeled) Transfers: Sit to/from Stand Sit to Stand: Supervision         General transfer comment: supervision for safety; pt able to maintain TDWB status   Ambulation/Gait Ambulation/Gait assistance: Min assist;Min guard Gait Distance (Feet): 35 Feet Assistive device: Rolling walker (2 wheeled) Gait Pattern/deviations: Step-to pattern Gait velocity: decreased   General Gait Details: pt maintained TDWB or NWB R LE; cues for proximity to RW and posture    Stairs             Wheelchair Mobility    Modified Rankin (Stroke Patients Only)       Balance Overall balance assessment: Needs  assistance Sitting-balance support: Feet supported;No upper extremity supported Sitting balance-Leahy Scale: Good     Standing balance support: Bilateral upper extremity supported;During functional activity Standing balance-Leahy Scale: Poor Standing balance comment: Relies on UE support for balance on RW                             Cognition Arousal/Alertness: Awake/alert Behavior During Therapy: WFL for tasks assessed/performed Overall Cognitive Status: Within Functional Limits for tasks assessed                                        Exercises General Exercises - Lower Extremity Ankle Circles/Pumps: AROM;Both;20 reps Quad Sets: AROM;Both;10 reps Hip ABduction/ADduction: AROM;Right;10 reps Straight Leg Raises: AROM;Right;10 reps    General Comments        Pertinent Vitals/Pain Pain Assessment: Faces Faces Pain Scale: Hurts little more Pain Location: R LE Pain Descriptors / Indicators: Sore Pain Intervention(s): Limited activity within patient's tolerance;Monitored during session;Repositioned    Home Living                      Prior Function            PT Goals (current goals can now be found in the care plan section) Progress towards PT goals: Progressing toward goals    Frequency    Min 6X/week      PT Plan Current plan  remains appropriate    Co-evaluation              AM-PAC PT "6 Clicks" Mobility   Outcome Measure  Help needed turning from your back to your side while in a flat bed without using bedrails?: None Help needed moving from lying on your back to sitting on the side of a flat bed without using bedrails?: A Little Help needed moving to and from a bed to a chair (including a wheelchair)?: A Little Help needed standing up from a chair using your arms (e.g., wheelchair or bedside chair)?: A Little Help needed to walk in hospital room?: A Little Help needed climbing 3-5 steps with a railing? : A  Lot 6 Click Score: 18    End of Session Equipment Utilized During Treatment: Gait belt;Right knee immobilizer Activity Tolerance: Patient tolerated treatment well Patient left: in chair;with call bell/phone within reach Nurse Communication: Mobility status PT Visit Diagnosis: Unsteadiness on feet (R26.81);Muscle weakness (generalized) (M62.81);Pain Pain - Right/Left: Right Pain - part of body: Knee     Time: 1200-1216 PT Time Calculation (min) (ACUTE ONLY): 16 min  Charges:  $Gait Training: 8-22 mins                     Erline Levine, PTA Acute Rehabilitation Services Pager: [phone] Office: [phone]     Carolynne Edouard 05/27/2018, 1:23 PM

## 2018-05-27 NOTE — Consult Note (Signed)
            [facility] CM Primary Care Navigator  05/27/2018  Heather Houston November 06, 1945 [id number]   Seen patient, niece Heather Houston) and brother in-law at the bedside to identify possible discharge needs.    Patient reports "falling on the gym bleacher" with acute onset of severe pain and difficulty to walk which had led to this admission. (closed fracture of lateral portion of right tibial plateau- ortho recommended conservative management with knee immobilizer, touch toe weightbearing, and allowing the joint to heal)  Patient endorses [facility] at [facility] provider.   [facility] [place] [facility] [employer] to obtain medicationswithout any problem.   Patient verbalized that she has been managing her medications at homestraight out of the containers.  She states that she has been driving prior to admission but son Heather Houston) will be providing transportation to her doctors' appointments after discharge. Reminded patient of her transportation benefits with [employer].  Patient reports that her son lives with her and will be serving as the primary caregiver at home. Patient's niece and sisters will also provide assistance when needed per patient.   Anticipated discharge plan isskilled nursing facility (SNF- in process)per therapy recommendation.  Patientvoiced understanding to call primary care provider's office when shereturnsback home,for a post discharge follow-up appointment within1- 2 weeksor sooner if needs arise.Patient letter (with PCP's contact number) wasprovided as a reminder.  Discussed with patient and familyregarding [facility] CM services available for health management and resourcesat homebut patient indicatedhavingnoneed for servicesat this time. She expressedunderstandingto seekreferralfrom primary care provider to [facility] care management ifdeemed  necessary and appropriatefor anyservices in thefuture- once she returnsback home.  [facility] care management information was provided for future needs that shemay have.  Primary care provider's office is listed as providing transition of care (TOC).   For additional questions please contact:  Karin Golden A. Danie Diehl, BSN, RN-BC [facility] PRIMARY CARE Navigator Cell: [phone]

## 2018-05-27 NOTE — Plan of Care (Signed)
  Problem: Activity: Goal: Risk for activity intolerance will decrease Outcome: Progressing   Problem: Nutrition: Goal: Adequate nutrition will be maintained Outcome: Progressing   

## 2018-05-27 NOTE — Discharge Summary (Signed)
Physician Discharge Summary  Patient ID: Heather Houston MRN: [id number] DOB/AGE: [birth year] 73 y.o.  Admit date: 05/23/2018 Discharge date: 05/27/2018  Admission Diagnoses:  Closed fracture of lateral portion of right tibial plateau  Discharge Diagnoses:  Principal Problem:   Closed fracture of lateral portion of right tibial plateau   Past Medical History:  Diagnosis Date  . Anxiety   . Closed fracture of lateral portion of right tibial plateau 05/24/2018  . HLD (hyperlipidemia)   . Hypertension     Surgeries:  none   Consultants (if any):   Discharged Condition: Improved  Hospital Course: Heather Houston is an 73 y.o. female who was admitted 05/23/2018 with a diagnosis of Closed fracture of lateral portion of right tibial plateau.  This was effectively an ACL tear with disruption of the spine with a posterolateral impaction fracture of the tibial plateau not amenable to internal fixation.  She was managed from a pain control standpoint with IV and po pain meds, physical therapy, but had an unsafe home discharge plan and is planned to transfer to SNF for more PT, TTWB on the right leg.  She was given perioperative antibiotics:  Anti-infectives (From admission, onward)   None    .  She was given sequential compression devices, early ambulation, and lovenox for DVT prophylaxis.  She benefited maximally from the hospital stay and there were no complications.    Recent vital signs:  Vitals:   05/26/18 2037 05/27/18 0336  BP: (!) 141/59 (!) 148/75  Pulse: 70 68  Resp: 20 20  Temp: 98.9 F (37.2 C) 98.2 F (36.8 C)  SpO2: 100% 100%    Recent laboratory studies:  Lab Results  Component Value Date   HGB 13.0 05/24/2018   HGB 12.8 05/24/2018   Lab Results  Component Value Date   WBC 9.3 05/24/2018   PLT 209 05/24/2018   No results found for: INR Lab Results  Component Value Date   NA 139 05/24/2018   K 3.5 05/24/2018   CL 106 05/24/2018   CO2 24 05/24/2018   BUN 16 05/24/2018   CREATININE 0.96 05/24/2018   GLUCOSE 147 (H) 05/24/2018    Discharge Medications:   Allergies as of 05/27/2018   No Known Allergies     Medication List    TAKE these medications   aspirin EC 325 MG tablet Take 1 tablet (325 mg total) by mouth 2 (two) times daily.   atenolol 50 MG tablet Commonly known as:  TENORMIN Take 50 mg by mouth daily.   baclofen 10 MG tablet Commonly known as:  LIORESAL Take 1 tablet (10 mg total) by mouth 3 (three) times daily. As needed for muscle spasm   esomeprazole 20 MG capsule Commonly known as:  NEXIUM Take 20 mg by mouth every morning. Before  breakfast   Fish Oil 1200 MG Caps Take 1 capsule by mouth 2 (two) times daily.   HYDROcodone-acetaminophen 10-325 MG tablet Commonly known as:  NORCO Take 1 tablet by mouth every 6 (six) hours as needed.   multivitamin with minerals Tabs tablet Take 1 tablet by mouth daily.   ondansetron 4 MG tablet Commonly known as:  ZOFRAN Take 1 tablet (4 mg total) by mouth every 8 (eight) hours as needed for nausea or vomiting.   sennosides-docusate sodium 8.6-50 MG tablet Commonly known as:  SENOKOT-S Take 2 tablets by mouth daily.   venlafaxine 37.5 MG tablet Commonly known as:  EFFEXOR Take 37.5 mg by mouth daily. Taking  1/2 tablet daily.   vitamin E 1000 UNIT capsule Generic drug:  vitamin E Take 1,000 Units by mouth 2 (two) times daily.            Durable Medical Equipment  (From admission, onward)         Start     Ordered   05/25/18 0951  For home use only DME Walker rolling  Once    Question:  Patient needs a walker to treat with the following condition  Answer:  Weakness   05/25/18 0951           Discharge Care Instructions  (From admission, onward)         Start     Ordered   05/27/18 0000  Touch down weight bearing    Question Answer Comment  Laterality right   Extremity Lower      05/27/18 1510          Diagnostic Studies: Ct Knee Right  Wo Contrast  Result Date: 05/24/2018 CLINICAL DATA:  Fall with knee pain EXAM: CT OF THE right KNEE WITHOUT CONTRAST TECHNIQUE: Multidetector CT imaging of the right knee was performed according to the standard protocol. Multiplanar CT image reconstructions were also generated. COMPARISON:  Radiograph 05/23/2002 FINDINGS: Bones/Joint/Cartilage Acute, mildly comminuted and displaced fracture involving the posterior lateral articular surface of the proximal tibia with about 6 mm of depression. Intra-articular avulsion fracture involving the anterior articular surface of the tibia in the region of ACL insertion. Small bone fragments between the tibial spines. Focal sclerosis in the medial tibial plateau likely a bone island. Sub articular cysts within the upper patella. Moderate hemarthrosis of the knee. Ligaments Suboptimally assessed by CT. Muscles and Tendons Minimal muscular atrophy. Quadriceps tendon and patellar tendon appear intact. Soft tissues Soft tissue edema at the knee.  14 mm medial popliteal fossa cyst. IMPRESSION: 1. Acute, slightly comminuted fracture involving the posterolateral plateau of the proximal tibia with about 6 mm depression. 2. Acute intra-articular avulsion fracture injury involving the anterior articular surface of the tibia in the region of ACL insertion. Several small osseous fragment/loose bodies within the central joint. 3. Moderate hemarthrosis Electronically Signed   By: Jasmine Pang M.D.   On: 05/24/2018 02:53   Dg Knee Complete 4 Views Right  Result Date: 05/23/2018 CLINICAL DATA:  Trip and fall to the right knee. Generalized knee pain with difficulty flexing and extending. EXAM: RIGHT KNEE - COMPLETE 4+ VIEW COMPARISON:  05/03/2018 FINDINGS: Degenerative changes in the right knee with medial and lateral compartment narrowing and small osteophyte formation. Cartilage calcification in the lateral compartment. Small osseous fragments demonstrated in the medial compartment  suggesting loose bodies. These were not seen previously, or possibly representing acute avulsion fragments from the tibial spines. Slight irregularity along the lateral tibial plateau may represent mild depression fracture. Moderate right knee effusion with increased density possibly representing hemorrhagic effusion. IMPRESSION: Degenerative changes in the right knee. Small osseous fragments demonstrated in the medial compartment suggesting loose bodies, possibly acute. Possible lateral tibial plateau fracture. Right knee effusion, probably hemorrhagic. Electronically Signed   By: Burman Nieves M.D.   On: 05/23/2018 23:44    Disposition: SNF, TTWB RLE  Discharge Instructions    Touch down weight bearing   Complete by:  As directed    Laterality:  right   Extremity:  Lower      Follow-up Information    Teryl Lucy, MD. Schedule an appointment as soon as possible for a visit in  1 week(s).   Specialty:  Orthopedic Surgery Contact information: [address] [place] [address] [phone]            Signed: Eulas PostJoshua P Cashlyn Huguley 05/27/2018, 3:11 PM

## 2018-05-27 NOTE — Progress Notes (Addendum)
Patient ID: Heather Houston, female   DOB: 11/29/1945, 73 y.o.   MRN: [id number]     Subjective:  Patient reports pain as mild to moderate.  Patient in the chair and in no acute distress.  Objective:   VITALS:   Vitals:   05/26/18 1507 05/26/18 2037 05/27/18 0336 05/27/18 1530  BP: (!) 109/46 (!) 141/59 (!) 148/75 (!) 145/62  Pulse: 72 70 68 62  Resp:  20 20 18   Temp: 97.7 F (36.5 C) 98.9 F (37.2 C) 98.2 F (36.8 C) 99 F (37.2 C)  TempSrc: Oral Oral Oral Oral  SpO2: 97% 100% 100% 99%  Weight:      Height:        ABD soft Sensation intact distally Dorsiflexion/Plantar flexion intact Knee immobilizer in place EHL/FHL firing   Lab Results  Component Value Date   WBC 9.3 05/24/2018   HGB 13.0 05/24/2018   HCT 42.1 05/24/2018   MCV 94.8 05/24/2018   PLT 209 05/24/2018   BMET    Component Value Date/Time   NA 139 05/24/2018 0156   K 3.5 05/24/2018 0156   CL 106 05/24/2018 0156   CO2 24 05/24/2018 0156   GLUCOSE 147 (H) 05/24/2018 0156   BUN 16 05/24/2018 0156   CREATININE 0.96 05/24/2018 0928   CALCIUM 8.8 (L) 05/24/2018 0156   GFRNONAA 58 (L) 05/24/2018 0928   GFRAA >60 05/24/2018 0174     Assessment/Plan:     Principal Problem:   Closed fracture of lateral portion of right tibial plateau   Advance diet Up with therapy Discharge to SNF when approved by INS TTWB right lower ext   Nelda Severe 05/27/2018, 4:00 PM  Discussed and agree with above.   Teryl Lucy, MD Cell [phone]

## 2018-05-27 NOTE — Plan of Care (Signed)

## 2018-05-28 MED ORDER — HYDROCODONE-ACETAMINOPHEN 10-325 MG PO TABS: 1.0000 | ORAL_TABLET | Freq: Four times a day (QID) | ORAL | 0 refills | Status: AC | PRN

## 2018-05-28 NOTE — Progress Notes (Addendum)
Patient ID: Heather Houston, female   DOB: January 30, 1946, 73 y.o.   MRN: [id number]     Subjective:  Patient reports pain as mild to moderate.  Patient in the chair and has some frustration with not knowing when she will be able to leave the hospital  Objective:   VITALS:   Vitals:   05/27/18 1530 05/27/18 2138 05/28/18 0558 05/28/18 0825  BP: (!) 145/62 (!) 148/59 (!) 123/52 (!) 132/46  Pulse: 62 65 (!) 55 64  Resp: 18 18 16 16   Temp: 99 F (37.2 C) 98.3 F (36.8 C) 98.3 F (36.8 C) (!) 97.3 F (36.3 C)  TempSrc: Oral Oral Oral Oral  SpO2: 99% 98% 100% 100%  Weight:      Height:        ABD soft Sensation intact distally Dorsiflexion/Plantar flexion intact Knee immobilizer in place  Lab Results  Component Value Date   WBC 9.3 05/24/2018   HGB 13.0 05/24/2018   HCT 42.1 05/24/2018   MCV 94.8 05/24/2018   PLT 209 05/24/2018   BMET    Component Value Date/Time   NA 139 05/24/2018 0156   K 3.5 05/24/2018 0156   CL 106 05/24/2018 0156   CO2 24 05/24/2018 0156   GLUCOSE 147 (H) 05/24/2018 0156   BUN 16 05/24/2018 0156   CREATININE 0.96 05/24/2018 0928   CALCIUM 8.8 (L) 05/24/2018 0156   GFRNONAA 58 (L) 05/24/2018 0928   GFRAA >60 05/24/2018 2094     Assessment/Plan:     Principal Problem:   Closed fracture of lateral portion of right tibial plateau   Advance diet Up with therapy Discharge to SNF Plan for DC when bed available TTWB right lower ext   Nelda Severe 05/28/2018, 1:25 PM  Seen and agree with above.   Rx signed for pain med for snf.   Teryl Lucy, MD Cell [phone]

## 2018-05-28 NOTE — Plan of Care (Signed)

## 2018-05-28 NOTE — Plan of Care (Signed)

## 2018-05-28 NOTE — Care Management Important Message (Signed)
Important Message  Patient Details  Name: Heather Houston MRN: [id number] Date of Birth: 07/10/1945   Medicare Important Message Given:  Yes    Heather Houston [name] 05/28/2018, 3:52 PM

## 2018-05-28 NOTE — Progress Notes (Signed)
CSW lvm with [facility] Tressia Danas to start insurance auth, as patient is not managed by [employer] therefore SNF will initiate auth. Patient's first choice of [facility] has no beds avail.   CSW will continue to follow up with [facility] on insurance authorization.   [place], [place] [phone]

## 2018-05-28 NOTE — Progress Notes (Signed)
Physical Therapy Treatment Patient Details Name: Heather Houston MRN: [id number] DOB: 1945/06/11 Today's Date: 05/28/2018    History of Present Illness Admit with Right posterior lateral tibial plateau fracture with likely avulsion of the ACL.  MD wants to have conservative managment with knee immobilizer.      PT Comments    Patient seen for mobility progression. Pt continues to make progress and requires min guard/min A for gait training. Continue to progress as tolerated.    Follow Up Recommendations  SNF(has no 24 hour care; if home is d/c plan, HHPT,HHOT, HHAide )     Equipment Recommendations  Rolling walker with 5" wheels;3in1 (PT);Wheelchair (measurements PT);Wheelchair cushion (measurements PT)    Recommendations for Other Services       Precautions / Restrictions Precautions Precautions: Fall;Knee Required Braces or Orthoses: Knee Immobilizer - Right Knee Immobilizer - Right: On at all times Restrictions Weight Bearing Restrictions: Yes RLE Weight Bearing: Touchdown weight bearing    Mobility  Bed Mobility Overal bed mobility: Modified Independent                Transfers Overall transfer level: Needs assistance Equipment used: Rolling walker (2 wheeled) Transfers: Sit to/from Stand Sit to Stand: Supervision         General transfer comment: supervision for safety  Ambulation/Gait Ambulation/Gait assistance: Min assist;Min guard Gait Distance (Feet): 50 Feet Assistive device: Rolling walker (2 wheeled) Gait Pattern/deviations: Step-to pattern Gait velocity: decreased   General Gait Details: cues for cadence and posture   Stairs             Wheelchair Mobility    Modified Rankin (Stroke Patients Only)       Balance Overall balance assessment: Needs assistance Sitting-balance support: Feet supported;No upper extremity supported Sitting balance-Leahy Scale: Good     Standing balance support: Bilateral upper extremity  supported;During functional activity Standing balance-Leahy Scale: Fair Standing balance comment: Relies on UE support for balance on RW, Pt is TDWB but maintains NWB                              Cognition Arousal/Alertness: Awake/alert Behavior During Therapy: WFL for tasks assessed/performed Overall Cognitive Status: Within Functional Limits for tasks assessed                                        Exercises      General Comments        Pertinent Vitals/Pain Pain Assessment: Faces Faces Pain Scale: Hurts little more Pain Location: R LE and headache Pain Descriptors / Indicators: Sore Pain Intervention(s): Limited activity within patient's tolerance;Monitored during session;Repositioned    Home Living                      Prior Function            PT Goals (current goals can now be found in the care plan section) Progress towards PT goals: Progressing toward goals    Frequency    Min 6X/week      PT Plan Current plan remains appropriate    Co-evaluation              AM-PAC PT "6 Clicks" Mobility   Outcome Measure  Help needed turning from your back to your side while in a flat bed without using bedrails?: None  Help needed moving from lying on your back to sitting on the side of a flat bed without using bedrails?: A Little Help needed moving to and from a bed to a chair (including a wheelchair)?: A Little Help needed standing up from a chair using your arms (e.g., wheelchair or bedside chair)?: A Little Help needed to walk in hospital room?: A Little Help needed climbing 3-5 steps with a railing? : A Lot 6 Click Score: 18    End of Session Equipment Utilized During Treatment: Gait belt;Right knee immobilizer Activity Tolerance: Patient tolerated treatment well Patient left: in chair;with call bell/phone within reach Nurse Communication: Mobility status PT Visit Diagnosis: Unsteadiness on feet (R26.81);Muscle  weakness (generalized) (M62.81);Pain Pain - Right/Left: Right Pain - part of body: Knee     Time: [id number] PT Time Calculation (min) (ACUTE ONLY): 18 min  Charges:  $Gait Training: 8-22 mins                     Erline LevineKellyn Garnet [place], PTA Acute Rehabilitation Services Pager: [phone] Office: [phone]     Carolynne EdouardKellyn R Catia Todorov 05/28/2018, 12:24 PM

## 2018-05-28 NOTE — Progress Notes (Signed)
CSW spoke with patient regarding [facility] having no beds and moving forward with [facility]. Patient is accepting.   [facility] started insurance auth on this day, CSW will continue to follow up.   [place], [place] [phone]

## 2018-05-29 NOTE — Progress Notes (Signed)
Occupational Therapy Treatment Patient Details Name: Heather Houston MRN: [id number] DOB: [birth year] Today's Date: 05/29/2018    History of present illness Admit with Right posterior lateral tibial plateau fracture with likely avulsion of the ACL.  MD wants to have conservative managment with knee immobilizer.     OT comments  Pt progressing towards OT this session. Able to complete tub transfer in gym with TUB BENCH, unable to perform due to WB status with 3 in 1. Pt able to perform dressing on LLE but continues to require assist for RLE due to KI. Educated Pt on safety in home environment and fall risk management (sitting for grooming tasks instead of standing - sponge bathing initially etc). Pt continues to require SNF level care - but if she goes home will REQUIRE and benefit from skilled OT at the [facility] level.    Follow Up Recommendations  SNF    Equipment Recommendations  3 in 1 bedside commode    Recommendations for Other Services      Precautions / Restrictions Precautions Precautions: Fall;Knee Required Braces or Orthoses: Knee Immobilizer - Right Knee Immobilizer - Right: On at all times Restrictions Weight Bearing Restrictions: Yes RLE Weight Bearing: Touchdown weight bearing       Mobility Bed Mobility Overal bed mobility: Modified Independent             General bed mobility comments: Pt received in ortho gym and left in recliner in room - NT this session  Transfers Overall transfer level: Needs assistance Equipment used: Rolling walker (2 wheeled) Transfers: Sit to/from Stand Sit to Stand: Supervision         General transfer comment: supervision for safety from recliner and BSC    Balance Overall balance assessment: Needs assistance Sitting-balance support: Feet supported;No upper extremity supported Sitting balance-Leahy Scale: Good     Standing balance support: Bilateral upper extremity supported;During functional activity Standing  balance-Leahy Scale: Fair Standing balance comment: pt able static stand for hand hygiene without UE support and maintain precautions                           ADL either performed or assessed with clinical judgement   ADL Overall ADL's : Needs assistance/impaired                         Toilet Transfer: Ambulation;RW;Supervision/safety Toilet Transfer Details (indicate cue type and reason): simulated through recliner transfer     Tub/ Shower Transfer: Tub transfer;Min guard;Minimal assistance;Tub bench;Rolling walker Tub/Shower Transfer Details (indicate cue type and reason): in ortho gym - Pt is unable to perform with 3 in 1. Also educated on sponge bathing for safety as another option Functional mobility during ADLs: Rolling walker;Min guard       Vision       Perception     Praxis      Cognition Arousal/Alertness: Awake/alert Behavior During Therapy: WFL for tasks assessed/performed Overall Cognitive Status: Within Functional Limits for tasks assessed                                          Exercises     Shoulder Instructions       General Comments Pt expressing frustration with communication, insurance process.     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: A Little Help from another person to put on and taking off regular lower body clothing?: A Little 6 Click Score: 20    End of Session Equipment Utilized During Treatment: Right knee immobilizer;Rolling walker;Gait belt  OT Visit Diagnosis: Unsteadiness on feet (R26.81) Pain - Right/Left: Right Pain - part of body: Leg   Activity Tolerance Patient tolerated treatment well   Patient Left in chair;with call bell/phone within reach   Nurse Communication Mobility status;Weight bearing status        Time: 5537-4827 OT Time Calculation (min): 24 min  Charges: OT General Charges $OT Visit: 1 Visit OT Treatments $Self Care/Home Management : 23-37 mins  [name] OTR/L Acute Rehabilitation Services Pager: [phone] Office: [phone]  [name] 05/29/2018, 5:25 PM

## 2018-05-29 NOTE — Progress Notes (Signed)
CSW spoke with patient and son Riley Lam who report patient is refusing to wait for [facility] insurance auth to discharge to SNF at this time. She will go home with home health. [facility] notified of patient's decision, as they report they are still waiting on [employer] auth but will cancel at this time.  CSW signing off.   [place], [place] [phone]

## 2018-05-29 NOTE — Progress Notes (Signed)
Patient ID: Heather Houston, female   DOB: 1945-08-30, 73 y.o.   MRN: [id number]     Subjective:  Patient reports pain as mild.  Patient in bed and in no acute distress  Objective:   VITALS:   Vitals:   05/28/18 1329 05/28/18 1649 05/28/18 2047 05/29/18 0510  BP: (!) 110/44 (!) 117/54 (!) 121/50 (!) 146/68  Pulse: 60 61 60 65  Resp: 17 16 16 14   Temp: 97.8 F (36.6 C) 98.9 F (37.2 C) 98.4 F (36.9 C) 98.4 F (36.9 C)  TempSrc: Oral Oral Oral Oral  SpO2: 97% 99% 100% 100%  Weight:      Height:        ABD soft Sensation intact distally Dorsiflexion/Plantar flexion intact   Lab Results  Component Value Date   WBC 9.3 05/24/2018   HGB 13.0 05/24/2018   HCT 42.1 05/24/2018   MCV 94.8 05/24/2018   PLT 209 05/24/2018   BMET    Component Value Date/Time   NA 139 05/24/2018 0156   K 3.5 05/24/2018 0156   CL 106 05/24/2018 0156   CO2 24 05/24/2018 0156   GLUCOSE 147 (H) 05/24/2018 0156   BUN 16 05/24/2018 0156   CREATININE 0.96 05/24/2018 0928   CALCIUM 8.8 (L) 05/24/2018 0156   GFRNONAA 58 (L) 05/24/2018 0928   GFRAA >60 05/24/2018 5176     Assessment/Plan:     Principal Problem:   Closed fracture of lateral portion of right tibial plateau   Advance diet Up with therapy Discharge to SNF TTWB Knee immobilizer at all times   Nelda Severe 05/29/2018, 8:04 AM   Teryl Lucy, MD Cell [phone]

## 2018-05-29 NOTE — Plan of Care (Signed)
  Problem: Education: Goal: Knowledge of General Education information will improve Description Including pain rating scale, medication(s)/side effects and non-pharmacologic comfort measures 05/29/2018 1723 by Coy SaunasBrown, Drayven Marchena A, RN Outcome: Adequate for Discharge 05/29/2018 1126 by Coy SaunasBrown, Dariella Gillihan A, RN Outcome: Progressing   Problem: Health Behavior/Discharge Planning: Goal: Ability to manage health-related needs will improve 05/29/2018 1723 by Coy SaunasBrown, Sun Kihn A, RN Outcome: Adequate for Discharge 05/29/2018 1126 by Coy SaunasBrown, Preslei Blakley A, RN Outcome: Progressing   Problem: Clinical Measurements: Goal: Ability to maintain clinical measurements within normal limits will improve 05/29/2018 1723 by Coy SaunasBrown, Oliana Gowens A, RN Outcome: Adequate for Discharge 05/29/2018 1126 by Coy SaunasBrown, Cathalina Barcia A, RN Outcome: Progressing Goal: Will remain free from infection 05/29/2018 1723 by Coy SaunasBrown, Mayerly Kaman A, RN Outcome: Adequate for Discharge 05/29/2018 1126 by Coy SaunasBrown, Anu Stagner A, RN Outcome: Progressing Goal: Diagnostic test results will improve 05/29/2018 1723 by Coy SaunasBrown, Chamar Broughton A, RN Outcome: Adequate for Discharge 05/29/2018 1126 by Coy SaunasBrown, Corban Kistler A, RN Outcome: Progressing Goal: Respiratory complications will improve 05/29/2018 1723 by Coy SaunasBrown, Reginald Mangels A, RN Outcome: Adequate for Discharge 05/29/2018 1126 by Coy SaunasBrown, Katelin Kutsch A, RN Outcome: Progressing Goal: Cardiovascular complication will be avoided 05/29/2018 1723 by Coy SaunasBrown, Hajra Port A, RN Outcome: Adequate for Discharge 05/29/2018 1126 by Coy SaunasBrown, Emmalise Huard A, RN Outcome: Progressing   Problem: Activity: Goal: Risk for activity intolerance will decrease 05/29/2018 1723 by Coy SaunasBrown, Travion Ke A, RN Outcome: Adequate for Discharge 05/29/2018 1126 by Coy SaunasBrown, Johni Narine A, RN Outcome: Progressing   Problem: Nutrition: Goal: Adequate nutrition will be maintained 05/29/2018 1723 by Coy SaunasBrown, Yamira Papa A, RN Outcome: Adequate for Discharge 05/29/2018 1126 by Coy SaunasBrown, Rihan Schueler A, RN Outcome: Progressing   Problem:  Coping: Goal: Level of anxiety will decrease 05/29/2018 1723 by Coy SaunasBrown, Nikiya Starn A, RN Outcome: Adequate for Discharge 05/29/2018 1126 by Coy SaunasBrown, Ashonti Leandro A, RN Outcome: Progressing   Problem: Elimination: Goal: Will not experience complications related to bowel motility 05/29/2018 1723 by Coy SaunasBrown, Elica Almas A, RN Outcome: Adequate for Discharge 05/29/2018 1126 by Coy SaunasBrown, Richerd Grime A, RN Outcome: Progressing Goal: Will not experience complications related to urinary retention 05/29/2018 1723 by Coy SaunasBrown, Acie Custis A, RN Outcome: Adequate for Discharge 05/29/2018 1126 by Coy SaunasBrown, Satoya Feeley A, RN Outcome: Progressing   Problem: Pain Managment: Goal: General experience of comfort will improve 05/29/2018 1723 by Coy SaunasBrown, Kayan Blissett A, RN Outcome: Adequate for Discharge 05/29/2018 1126 by Coy SaunasBrown, Mimie Goering A, RN Outcome: Progressing   Problem: Safety: Goal: Ability to remain free from injury will improve 05/29/2018 1723 by Coy SaunasBrown, Blaike Newburn A, RN Outcome: Adequate for Discharge 05/29/2018 1126 by Coy SaunasBrown, Asael Pann A, RN Outcome: Progressing   Problem: Skin Integrity: Goal: Risk for impaired skin integrity will decrease 05/29/2018 1723 by Coy SaunasBrown, Marionna Gonia A, RN Outcome: Adequate for Discharge 05/29/2018 1126 by Coy SaunasBrown, Kirsty Monjaraz A, RN Outcome: Progressing

## 2018-05-29 NOTE — Progress Notes (Signed)
Patient suffers from closed fracture of lateral portion of right tibial plateau which impairs their ability to perform daily activities like ambulate in the home.  A walker alone will not resolve the issues with performing activities of daily living. A wheelchair with elevating leg rests will allow patient to safely perform daily activities.  The patient can self propel in the home or has a caregiver who can provide assistance.    Heather LevineKellyn Sarkis Houston, PTA Acute Rehabilitation Services Pager: [phone] Office: [phone]

## 2018-05-29 NOTE — Discharge Summary (Signed)
Physician Discharge Summary  Patient ID: Heather ScotlandRuther L Winberry MRN: [id number] DOB/AGE: 75-Dec-1947 75 y.o.  Admit date: 05/23/2018 Discharge date: 05/29/2018  Admission Diagnoses:  Closed fracture of lateral portion of right tibial plateau  Discharge Diagnoses:  Principal Problem:   Closed fracture of lateral portion of right tibial plateau   Past Medical History:  Diagnosis Date  . Anxiety   . Closed fracture of lateral portion of right tibial plateau 05/24/2018  . HLD (hyperlipidemia)   . Hypertension     Surgeries:  on * No surgery found *   Consultants (if any):   Discharged Condition: Improved  Hospital Course: Heather Houston is an 73 y.o. female who was admitted 05/23/2018 with a diagnosis of Closed fracture of lateral portion of right tibial plateau and went to the operating room on * No surgery found * and underwent the above named procedures.    She was given perioperative antibiotics:  Anti-infectives (From admission, onward)   None    .  She was given sequential compression devices, early ambulation, and lovenox for DVT prophylaxis.  She benefited maximally from the hospital stay and there were no complications.    Recent vital signs:  Vitals:   05/28/18 2047 05/29/18 0510  BP: (!) 121/50 (!) 146/68  Pulse: 60 65  Resp: 16 14  Temp: 98.4 F (36.9 C) 98.4 F (36.9 C)  SpO2: 100% 100%    Recent laboratory studies:  Lab Results  Component Value Date   HGB 13.0 05/24/2018   HGB 12.8 05/24/2018   Lab Results  Component Value Date   WBC 9.3 05/24/2018   PLT 209 05/24/2018   No results found for: INR Lab Results  Component Value Date   NA 139 05/24/2018   K 3.5 05/24/2018   CL 106 05/24/2018   CO2 24 05/24/2018   BUN 16 05/24/2018   CREATININE 0.96 05/24/2018   GLUCOSE 147 (H) 05/24/2018    Discharge Medications:   Allergies as of 05/29/2018   No Known Allergies     Medication List    TAKE these medications   aspirin EC 325 MG tablet Take 1  tablet (325 mg total) by mouth 2 (two) times daily.   atenolol 50 MG tablet Commonly known as:  TENORMIN Take 50 mg by mouth daily.   baclofen 10 MG tablet Commonly known as:  LIORESAL Take 1 tablet (10 mg total) by mouth 3 (three) times daily. As needed for muscle spasm   esomeprazole 20 MG capsule Commonly known as:  NEXIUM Take 20 mg by mouth every morning. Before  breakfast   Fish Oil 1200 MG Caps Take 1 capsule by mouth 2 (two) times daily.   HYDROcodone-acetaminophen 10-325 MG tablet Commonly known as:  NORCO Take 1 tablet by mouth every 6 (six) hours as needed.   multivitamin with minerals Tabs tablet Take 1 tablet by mouth daily.   ondansetron 4 MG tablet Commonly known as:  ZOFRAN Take 1 tablet (4 mg total) by mouth every 8 (eight) hours as needed for nausea or vomiting.   sennosides-docusate sodium 8.6-50 MG tablet Commonly known as:  SENOKOT-S Take 2 tablets by mouth daily.   venlafaxine 37.5 MG tablet Commonly known as:  EFFEXOR Take 37.5 mg by mouth daily. Taking 1/2 tablet daily.   vitamin E 1000 UNIT capsule Generic drug:  vitamin E Take 1,000 Units by mouth 2 (two) times daily.            Durable Medical Equipment  (  From admission, onward)         Start     Ordered   05/25/18 0951  For home use only DME Walker rolling  Once    Question:  Patient needs a walker to treat with the following condition  Answer:  Weakness   05/25/18 0951           Discharge Care Instructions  (From admission, onward)         Start     Ordered   05/27/18 0000  Touch down weight bearing    Question Answer Comment  Laterality right   Extremity Lower      05/27/18 1510          Diagnostic Studies: Ct Knee Right Wo Contrast  Result Date: 05/24/2018 CLINICAL DATA:  Fall with knee pain EXAM: CT OF THE right KNEE WITHOUT CONTRAST TECHNIQUE: Multidetector CT imaging of the right knee was performed according to the standard protocol. Multiplanar CT image  reconstructions were also generated. COMPARISON:  Radiograph 05/23/2002 FINDINGS: Bones/Joint/Cartilage Acute, mildly comminuted and displaced fracture involving the posterior lateral articular surface of the proximal tibia with about 6 mm of depression. Intra-articular avulsion fracture involving the anterior articular surface of the tibia in the region of ACL insertion. Small bone fragments between the tibial spines. Focal sclerosis in the medial tibial plateau likely a bone island. Sub articular cysts within the upper patella. Moderate hemarthrosis of the knee. Ligaments Suboptimally assessed by CT. Muscles and Tendons Minimal muscular atrophy. Quadriceps tendon and patellar tendon appear intact. Soft tissues Soft tissue edema at the knee.  14 mm medial popliteal fossa cyst. IMPRESSION: 1. Acute, slightly comminuted fracture involving the posterolateral plateau of the proximal tibia with about 6 mm depression. 2. Acute intra-articular avulsion fracture injury involving the anterior articular surface of the tibia in the region of ACL insertion. Several small osseous fragment/loose bodies within the central joint. 3. Moderate hemarthrosis Electronically Signed   By: Jasmine Pang M.D.   On: 05/24/2018 02:53   Dg Knee Complete 4 Views Right  Result Date: 05/23/2018 CLINICAL DATA:  Trip and fall to the right knee. Generalized knee pain with difficulty flexing and extending. EXAM: RIGHT KNEE - COMPLETE 4+ VIEW COMPARISON:  05/03/2018 FINDINGS: Degenerative changes in the right knee with medial and lateral compartment narrowing and small osteophyte formation. Cartilage calcification in the lateral compartment. Small osseous fragments demonstrated in the medial compartment suggesting loose bodies. These were not seen previously, or possibly representing acute avulsion fragments from the tibial spines. Slight irregularity along the lateral tibial plateau may represent mild depression fracture. Moderate right knee  effusion with increased density possibly representing hemorrhagic effusion. IMPRESSION: Degenerative changes in the right knee. Small osseous fragments demonstrated in the medial compartment suggesting loose bodies, possibly acute. Possible lateral tibial plateau fracture. Right knee effusion, probably hemorrhagic. Electronically Signed   By: Burman Nieves M.D.   On: 05/23/2018 23:44    Disposition: Discharge disposition: 03-Skilled Nursing Facility       Discharge Instructions    Touch down weight bearing   Complete by:  As directed    Laterality:  right   Extremity:  Lower      Follow-up Information    Teryl Lucy, MD. Schedule an appointment as soon as possible for a visit in 1 week(s).   Specialty:  Orthopedic Surgery Contact information: [address] [place] [address] [phone]            Signed:  Eulas PostJoshua P Dolce Sylvia 05/29/2018, 8:06 AM

## 2018-05-29 NOTE — Plan of Care (Signed)

## 2018-05-29 NOTE — Progress Notes (Addendum)
Patient requested to speak with Morrie Sheldon the Child psychotherapist.  I went to Ashley's desk and told her the patient wanted to speak to her.  Two hours later the patient was upset because SW didn't come see her.  Patients son Delrae Alfred called and was very upset and said he wants her discharged.  Gave patients son the SW phone number to speak with her directly.

## 2018-05-29 NOTE — Progress Notes (Signed)
Physical Therapy Treatment Patient Details Name: Heather Houston MRN: [id number] DOB: 11-20-1945 Today's Date: 05/29/2018    History of Present Illness Admit with Right posterior lateral tibial plateau fracture with likely avulsion of the ACL.  MD wants to have conservative managment with knee immobilizer.      PT Comments    Patient seen for mobility progression. Pt requires supervision/min guard for transfers and short distance gait training. This session focused on stair training and pt is unable to negotiate stairs simulating home entrance. Pt has decided to d/c home and will need ambulance transport for safe entry into home. Recommend HHPT for further skilled PT services to maximize independence and safety with mobility.     Follow Up Recommendations  SNF(has no 24 hour care; if home is d/c plan, HHPT,HHOT, HHAide )     Equipment Recommendations  Rolling walker with 5" wheels;3in1 (PT);Wheelchair (measurements PT);Wheelchair cushion (measurements PT);Other (comment)(elevating leg rests)    Recommendations for Other Services       Precautions / Restrictions Precautions Precautions: Fall;Knee Required Braces or Orthoses: Knee Immobilizer - Right Knee Immobilizer - Right: On at all times Restrictions Weight Bearing Restrictions: Yes RLE Weight Bearing: Touchdown weight bearing    Mobility  Bed Mobility Overal bed mobility: Modified Independent                Transfers Overall transfer level: Needs assistance Equipment used: Rolling walker (2 wheeled) Transfers: Sit to/from Stand Sit to Stand: Supervision         General transfer comment: supervision for safety from recliner and BSC  Ambulation/Gait Ambulation/Gait assistance: Min guard Gait Distance (Feet): (~40 ft total during session) Assistive device: Rolling walker (2 wheeled) Gait Pattern/deviations: Step-to pattern Gait velocity: decreased   General Gait Details: cues for resting foot on ground  when feeling tired and taking standing rest breaks; pt continues to maintain NWB R LE   Stairs Stairs: Yes Stairs assistance: Mod assist;+2 safety/equipment Stair Management: No rails;Step to pattern;Backwards Number of Stairs: 1 General stair comments: pt is able to ascend/descend single step but unable to ascend second step simulating home entrance; cues for sequencing and technique   Wheelchair Mobility    Modified Rankin (Stroke Patients Only)       Balance Overall balance assessment: Needs assistance Sitting-balance support: Feet supported;No upper extremity supported Sitting balance-Leahy Scale: Good     Standing balance support: Bilateral upper extremity supported;During functional activity Standing balance-Leahy Scale: Fair Standing balance comment: pt able static stand for hand hygiene without UE support and maintain precautions                            Cognition Arousal/Alertness: Awake/alert Behavior During Therapy: WFL for tasks assessed/performed Overall Cognitive Status: Within Functional Limits for tasks assessed                                        Exercises      General Comments        Pertinent Vitals/Pain Pain Assessment: Faces Faces Pain Scale: Hurts a little bit Pain Location: R LE with mobility Pain Descriptors / Indicators: Sore Pain Intervention(s): Limited activity within patient's tolerance;Monitored during session;Repositioned    Home Living                      Prior Function  PT Goals (current goals can now be found in the care plan section) Progress towards PT goals: Progressing toward goals    Frequency    Min 6X/week      PT Plan Current plan remains appropriate    Co-evaluation              AM-PAC PT "6 Clicks" Mobility   Outcome Measure  Help needed turning from your back to your side while in a flat bed without using bedrails?: None Help needed moving  from lying on your back to sitting on the side of a flat bed without using bedrails?: A Little Help needed moving to and from a bed to a chair (including a wheelchair)?: A Little Help needed standing up from a chair using your arms (e.g., wheelchair or bedside chair)?: A Little Help needed to walk in hospital room?: A Little Help needed climbing 3-5 steps with a railing? : A Lot 6 Click Score: 18    End of Session Equipment Utilized During Treatment: Gait belt;Right knee immobilizer Activity Tolerance: Patient tolerated treatment well Patient left: in chair;with call bell/phone within reach;Other (comment)(with OT in ortho gym) Nurse Communication: Mobility status PT Visit Diagnosis: Unsteadiness on feet (R26.81);Muscle weakness (generalized) (M62.81);Pain Pain - Right/Left: Right Pain - part of body: Knee     Time: 1534-1600 PT Time Calculation (min) (ACUTE ONLY): 26 min  Charges:  $Gait Training: 23-37 mins                     Erline Levine, PTA Acute Rehabilitation Services Pager: [phone] Office: [phone]     Carolynne Edouard 05/29/2018, 4:19 PM

## 2018-05-29 NOTE — Care Management Note (Signed)
Case Management Note  Patient Details  Name: MYLINDA HASKETT MRN: [id number] Date of Birth: 1945-07-08  Subjective/Objective:                    Action/Plan:  Spoke w patient, presented with medicare quality ratings for [facility], patient chose [facility]. Referral placed to [facility]. Needs RW and 3/1, orders placed and will be delivered to room prior to DC. Spoke w patient's son who will provide transport home. No other CM needs identified.  Expected Discharge Date:  05/29/18               Expected Discharge Plan:  Home w Home Health Services  In-House Referral:  NA  Discharge planning Services  CM Consult  Post Acute Care Choice:  Home Health, Durable Medical Equipment Choice offered to:  Patient  DME Arranged:  3-N-1, Walker rolling DME Agency:  [facility]  HH Arranged:  PT, OT [facility] Agency:  [facility]  Status of Service:  Completed, signed off  If discussed at Long Length of Stay Meetings, dates discussed:    Additional Comments:  Lawerance Sabal, RN 05/29/2018, 4:20 PM

## 2018-05-31 DIAGNOSIS — S82201D Unspecified fracture of shaft of right tibia, subsequent encounter for closed fracture with routine healing: Secondary | ICD-10-CM | POA: Diagnosis not present

## 2018-05-31 DIAGNOSIS — E785 Hyperlipidemia, unspecified: Secondary | ICD-10-CM | POA: Diagnosis not present

## 2018-05-31 DIAGNOSIS — S8331XD Tear of articular cartilage of right knee, current, subsequent encounter: Secondary | ICD-10-CM | POA: Diagnosis not present

## 2018-05-31 DIAGNOSIS — I10 Essential (primary) hypertension: Secondary | ICD-10-CM | POA: Diagnosis not present

## 2018-05-31 DIAGNOSIS — F419 Anxiety disorder, unspecified: Secondary | ICD-10-CM | POA: Diagnosis not present

## 2018-05-31 DIAGNOSIS — Z7982 Long term (current) use of aspirin: Secondary | ICD-10-CM | POA: Diagnosis not present

## 2018-06-03 DIAGNOSIS — Z7982 Long term (current) use of aspirin: Secondary | ICD-10-CM | POA: Diagnosis not present

## 2018-06-03 DIAGNOSIS — S82201D Unspecified fracture of shaft of right tibia, subsequent encounter for closed fracture with routine healing: Secondary | ICD-10-CM | POA: Diagnosis not present

## 2018-06-03 DIAGNOSIS — I10 Essential (primary) hypertension: Secondary | ICD-10-CM | POA: Diagnosis not present

## 2018-06-03 DIAGNOSIS — F419 Anxiety disorder, unspecified: Secondary | ICD-10-CM | POA: Diagnosis not present

## 2018-06-03 DIAGNOSIS — E785 Hyperlipidemia, unspecified: Secondary | ICD-10-CM | POA: Diagnosis not present

## 2018-06-03 DIAGNOSIS — S8331XD Tear of articular cartilage of right knee, current, subsequent encounter: Secondary | ICD-10-CM | POA: Diagnosis not present

## 2018-06-05 DIAGNOSIS — I10 Essential (primary) hypertension: Secondary | ICD-10-CM | POA: Diagnosis not present

## 2018-06-05 DIAGNOSIS — F419 Anxiety disorder, unspecified: Secondary | ICD-10-CM | POA: Diagnosis not present

## 2018-06-05 DIAGNOSIS — Z7982 Long term (current) use of aspirin: Secondary | ICD-10-CM | POA: Diagnosis not present

## 2018-06-05 DIAGNOSIS — S82201D Unspecified fracture of shaft of right tibia, subsequent encounter for closed fracture with routine healing: Secondary | ICD-10-CM | POA: Diagnosis not present

## 2018-06-05 DIAGNOSIS — E785 Hyperlipidemia, unspecified: Secondary | ICD-10-CM | POA: Diagnosis not present

## 2018-06-05 DIAGNOSIS — S8331XD Tear of articular cartilage of right knee, current, subsequent encounter: Secondary | ICD-10-CM | POA: Diagnosis not present

## 2018-06-12 DIAGNOSIS — S82144D Nondisplaced bicondylar fracture of right tibia, subsequent encounter for closed fracture with routine healing: Secondary | ICD-10-CM | POA: Diagnosis not present

## 2018-06-13 DIAGNOSIS — I10 Essential (primary) hypertension: Secondary | ICD-10-CM | POA: Diagnosis not present

## 2018-06-13 DIAGNOSIS — E785 Hyperlipidemia, unspecified: Secondary | ICD-10-CM | POA: Diagnosis not present

## 2018-06-13 DIAGNOSIS — Z7982 Long term (current) use of aspirin: Secondary | ICD-10-CM | POA: Diagnosis not present

## 2018-06-13 DIAGNOSIS — S82201D Unspecified fracture of shaft of right tibia, subsequent encounter for closed fracture with routine healing: Secondary | ICD-10-CM | POA: Diagnosis not present

## 2018-06-13 DIAGNOSIS — S8331XD Tear of articular cartilage of right knee, current, subsequent encounter: Secondary | ICD-10-CM | POA: Diagnosis not present

## 2018-06-13 DIAGNOSIS — F419 Anxiety disorder, unspecified: Secondary | ICD-10-CM | POA: Diagnosis not present

## 2018-06-14 DIAGNOSIS — I10 Essential (primary) hypertension: Secondary | ICD-10-CM | POA: Diagnosis not present

## 2018-06-14 DIAGNOSIS — S82201D Unspecified fracture of shaft of right tibia, subsequent encounter for closed fracture with routine healing: Secondary | ICD-10-CM | POA: Diagnosis not present

## 2018-06-14 DIAGNOSIS — E785 Hyperlipidemia, unspecified: Secondary | ICD-10-CM | POA: Diagnosis not present

## 2018-06-14 DIAGNOSIS — F419 Anxiety disorder, unspecified: Secondary | ICD-10-CM | POA: Diagnosis not present

## 2018-06-14 DIAGNOSIS — Z7982 Long term (current) use of aspirin: Secondary | ICD-10-CM | POA: Diagnosis not present

## 2018-06-14 DIAGNOSIS — S8331XD Tear of articular cartilage of right knee, current, subsequent encounter: Secondary | ICD-10-CM | POA: Diagnosis not present

## 2018-06-17 DIAGNOSIS — S82144D Nondisplaced bicondylar fracture of right tibia, subsequent encounter for closed fracture with routine healing: Secondary | ICD-10-CM | POA: Diagnosis not present

## 2018-06-18 DIAGNOSIS — I10 Essential (primary) hypertension: Secondary | ICD-10-CM | POA: Diagnosis not present

## 2018-06-18 DIAGNOSIS — Z7982 Long term (current) use of aspirin: Secondary | ICD-10-CM | POA: Diagnosis not present

## 2018-06-18 DIAGNOSIS — S82201D Unspecified fracture of shaft of right tibia, subsequent encounter for closed fracture with routine healing: Secondary | ICD-10-CM | POA: Diagnosis not present

## 2018-06-18 DIAGNOSIS — F419 Anxiety disorder, unspecified: Secondary | ICD-10-CM | POA: Diagnosis not present

## 2018-06-18 DIAGNOSIS — S8331XD Tear of articular cartilage of right knee, current, subsequent encounter: Secondary | ICD-10-CM | POA: Diagnosis not present

## 2018-06-18 DIAGNOSIS — E785 Hyperlipidemia, unspecified: Secondary | ICD-10-CM | POA: Diagnosis not present

## 2018-06-28 DIAGNOSIS — E785 Hyperlipidemia, unspecified: Secondary | ICD-10-CM | POA: Diagnosis not present

## 2018-06-28 DIAGNOSIS — Z7982 Long term (current) use of aspirin: Secondary | ICD-10-CM | POA: Diagnosis not present

## 2018-06-28 DIAGNOSIS — S82201D Unspecified fracture of shaft of right tibia, subsequent encounter for closed fracture with routine healing: Secondary | ICD-10-CM | POA: Diagnosis not present

## 2018-06-28 DIAGNOSIS — S8331XD Tear of articular cartilage of right knee, current, subsequent encounter: Secondary | ICD-10-CM | POA: Diagnosis not present

## 2018-06-28 DIAGNOSIS — I10 Essential (primary) hypertension: Secondary | ICD-10-CM | POA: Diagnosis not present

## 2018-06-28 DIAGNOSIS — F419 Anxiety disorder, unspecified: Secondary | ICD-10-CM | POA: Diagnosis not present

## 2018-07-01 DIAGNOSIS — S82144D Nondisplaced bicondylar fracture of right tibia, subsequent encounter for closed fracture with routine healing: Secondary | ICD-10-CM | POA: Diagnosis not present

## 2018-07-08 DIAGNOSIS — Z9181 History of falling: Secondary | ICD-10-CM | POA: Diagnosis not present

## 2018-07-08 DIAGNOSIS — I10 Essential (primary) hypertension: Secondary | ICD-10-CM | POA: Diagnosis not present

## 2018-07-08 DIAGNOSIS — Y92213 High school as the place of occurrence of the external cause: Secondary | ICD-10-CM | POA: Diagnosis not present

## 2018-07-08 DIAGNOSIS — W108XXD Fall (on) (from) other stairs and steps, subsequent encounter: Secondary | ICD-10-CM | POA: Diagnosis not present

## 2018-07-08 DIAGNOSIS — E785 Hyperlipidemia, unspecified: Secondary | ICD-10-CM | POA: Diagnosis not present

## 2018-07-08 DIAGNOSIS — S82201D Unspecified fracture of shaft of right tibia, subsequent encounter for closed fracture with routine healing: Secondary | ICD-10-CM | POA: Diagnosis not present

## 2018-07-08 DIAGNOSIS — Z7982 Long term (current) use of aspirin: Secondary | ICD-10-CM | POA: Diagnosis not present

## 2018-07-08 DIAGNOSIS — F419 Anxiety disorder, unspecified: Secondary | ICD-10-CM | POA: Diagnosis not present

## 2018-07-08 DIAGNOSIS — S8331XD Tear of articular cartilage of right knee, current, subsequent encounter: Secondary | ICD-10-CM | POA: Diagnosis not present

## 2018-07-18 DIAGNOSIS — Z7982 Long term (current) use of aspirin: Secondary | ICD-10-CM | POA: Diagnosis not present

## 2018-07-18 DIAGNOSIS — I10 Essential (primary) hypertension: Secondary | ICD-10-CM | POA: Diagnosis not present

## 2018-07-18 DIAGNOSIS — W108XXD Fall (on) (from) other stairs and steps, subsequent encounter: Secondary | ICD-10-CM | POA: Diagnosis not present

## 2018-07-18 DIAGNOSIS — Z9181 History of falling: Secondary | ICD-10-CM | POA: Diagnosis not present

## 2018-07-18 DIAGNOSIS — E785 Hyperlipidemia, unspecified: Secondary | ICD-10-CM | POA: Diagnosis not present

## 2018-07-18 DIAGNOSIS — Y92213 High school as the place of occurrence of the external cause: Secondary | ICD-10-CM | POA: Diagnosis not present

## 2018-07-18 DIAGNOSIS — S8331XD Tear of articular cartilage of right knee, current, subsequent encounter: Secondary | ICD-10-CM | POA: Diagnosis not present

## 2018-07-18 DIAGNOSIS — F419 Anxiety disorder, unspecified: Secondary | ICD-10-CM | POA: Diagnosis not present

## 2018-07-18 DIAGNOSIS — S82201D Unspecified fracture of shaft of right tibia, subsequent encounter for closed fracture with routine healing: Secondary | ICD-10-CM | POA: Diagnosis not present

## 2018-07-31 DIAGNOSIS — S82144D Nondisplaced bicondylar fracture of right tibia, subsequent encounter for closed fracture with routine healing: Secondary | ICD-10-CM | POA: Diagnosis not present

## 2018-08-06 DIAGNOSIS — M25561 Pain in right knee: Secondary | ICD-10-CM | POA: Diagnosis not present

## 2018-08-06 DIAGNOSIS — M25661 Stiffness of right knee, not elsewhere classified: Secondary | ICD-10-CM | POA: Diagnosis not present

## 2018-08-06 DIAGNOSIS — M6281 Muscle weakness (generalized): Secondary | ICD-10-CM | POA: Diagnosis not present

## 2018-08-06 DIAGNOSIS — R262 Difficulty in walking, not elsewhere classified: Secondary | ICD-10-CM | POA: Diagnosis not present

## 2018-08-08 DIAGNOSIS — M19011 Primary osteoarthritis, right shoulder: Secondary | ICD-10-CM | POA: Diagnosis not present

## 2018-08-08 DIAGNOSIS — M25661 Stiffness of right knee, not elsewhere classified: Secondary | ICD-10-CM | POA: Diagnosis not present

## 2018-08-08 DIAGNOSIS — R262 Difficulty in walking, not elsewhere classified: Secondary | ICD-10-CM | POA: Diagnosis not present

## 2018-08-08 DIAGNOSIS — M25561 Pain in right knee: Secondary | ICD-10-CM | POA: Diagnosis not present

## 2018-08-08 DIAGNOSIS — M6281 Muscle weakness (generalized): Secondary | ICD-10-CM | POA: Diagnosis not present

## 2018-08-21 DIAGNOSIS — S83511A Sprain of anterior cruciate ligament of right knee, initial encounter: Secondary | ICD-10-CM | POA: Diagnosis not present

## 2018-10-02 DIAGNOSIS — S82144A Nondisplaced bicondylar fracture of right tibia, initial encounter for closed fracture: Secondary | ICD-10-CM | POA: Diagnosis not present

## 2018-10-10 DIAGNOSIS — M25561 Pain in right knee: Secondary | ICD-10-CM | POA: Diagnosis not present

## 2018-10-10 DIAGNOSIS — R262 Difficulty in walking, not elsewhere classified: Secondary | ICD-10-CM | POA: Diagnosis not present

## 2018-10-10 DIAGNOSIS — M25661 Stiffness of right knee, not elsewhere classified: Secondary | ICD-10-CM | POA: Diagnosis not present

## 2018-10-10 DIAGNOSIS — M6281 Muscle weakness (generalized): Secondary | ICD-10-CM | POA: Diagnosis not present

## 2018-11-20 DIAGNOSIS — M25561 Pain in right knee: Secondary | ICD-10-CM | POA: Diagnosis not present

## 2018-11-20 DIAGNOSIS — S82144A Nondisplaced bicondylar fracture of right tibia, initial encounter for closed fracture: Secondary | ICD-10-CM | POA: Diagnosis not present

## 2019-01-13 DIAGNOSIS — Z79899 Other long term (current) drug therapy: Secondary | ICD-10-CM | POA: Diagnosis not present

## 2019-01-13 DIAGNOSIS — E7800 Pure hypercholesterolemia, unspecified: Secondary | ICD-10-CM | POA: Diagnosis not present

## 2019-01-22 DIAGNOSIS — Z6828 Body mass index (BMI) 28.0-28.9, adult: Secondary | ICD-10-CM | POA: Diagnosis not present

## 2019-01-22 DIAGNOSIS — H6123 Impacted cerumen, bilateral: Secondary | ICD-10-CM | POA: Diagnosis not present

## 2019-01-22 DIAGNOSIS — E2839 Other primary ovarian failure: Secondary | ICD-10-CM | POA: Diagnosis not present

## 2019-01-22 DIAGNOSIS — Z23 Encounter for immunization: Secondary | ICD-10-CM | POA: Diagnosis not present

## 2019-01-22 DIAGNOSIS — Z1231 Encounter for screening mammogram for malignant neoplasm of breast: Secondary | ICD-10-CM | POA: Diagnosis not present

## 2019-05-10 DIAGNOSIS — H81399 Other peripheral vertigo, unspecified ear: Secondary | ICD-10-CM | POA: Diagnosis not present

## 2019-05-30 DIAGNOSIS — M1711 Unilateral primary osteoarthritis, right knee: Secondary | ICD-10-CM | POA: Diagnosis not present

## 2019-07-18 DIAGNOSIS — H524 Presbyopia: Secondary | ICD-10-CM | POA: Diagnosis not present

## 2019-07-18 DIAGNOSIS — H5203 Hypermetropia, bilateral: Secondary | ICD-10-CM | POA: Diagnosis not present

## 2019-07-18 DIAGNOSIS — H52209 Unspecified astigmatism, unspecified eye: Secondary | ICD-10-CM | POA: Diagnosis not present

## 2019-07-22 DIAGNOSIS — E7800 Pure hypercholesterolemia, unspecified: Secondary | ICD-10-CM | POA: Diagnosis not present

## 2019-07-22 DIAGNOSIS — Z79899 Other long term (current) drug therapy: Secondary | ICD-10-CM | POA: Diagnosis not present

## 2019-10-02 DIAGNOSIS — I10 Essential (primary) hypertension: Secondary | ICD-10-CM | POA: Diagnosis not present

## 2019-10-02 DIAGNOSIS — Z6828 Body mass index (BMI) 28.0-28.9, adult: Secondary | ICD-10-CM | POA: Diagnosis not present

## 2019-10-02 DIAGNOSIS — E2839 Other primary ovarian failure: Secondary | ICD-10-CM | POA: Diagnosis not present

## 2019-10-02 DIAGNOSIS — Z139 Encounter for screening, unspecified: Secondary | ICD-10-CM | POA: Diagnosis not present

## 2019-10-02 DIAGNOSIS — E7800 Pure hypercholesterolemia, unspecified: Secondary | ICD-10-CM | POA: Diagnosis not present

## 2019-10-02 DIAGNOSIS — Z1231 Encounter for screening mammogram for malignant neoplasm of breast: Secondary | ICD-10-CM | POA: Diagnosis not present

## 2019-10-02 DIAGNOSIS — Z9181 History of falling: Secondary | ICD-10-CM | POA: Diagnosis not present

## 2019-10-02 DIAGNOSIS — F329 Major depressive disorder, single episode, unspecified: Secondary | ICD-10-CM | POA: Diagnosis not present

## 2019-10-08 DIAGNOSIS — K862 Cyst of pancreas: Secondary | ICD-10-CM | POA: Diagnosis not present

## 2019-10-08 DIAGNOSIS — R1012 Left upper quadrant pain: Secondary | ICD-10-CM | POA: Diagnosis not present

## 2019-11-28 DIAGNOSIS — E2839 Other primary ovarian failure: Secondary | ICD-10-CM | POA: Diagnosis not present

## 2019-11-28 DIAGNOSIS — Z1231 Encounter for screening mammogram for malignant neoplasm of breast: Secondary | ICD-10-CM | POA: Diagnosis not present

## 2019-11-28 DIAGNOSIS — M8580 Other specified disorders of bone density and structure, unspecified site: Secondary | ICD-10-CM | POA: Diagnosis not present

## 2019-12-09 DIAGNOSIS — Z20822 Contact with and (suspected) exposure to covid-19: Secondary | ICD-10-CM | POA: Diagnosis not present

## 2019-12-10 DIAGNOSIS — J209 Acute bronchitis, unspecified: Secondary | ICD-10-CM | POA: Diagnosis not present

## 2019-12-10 DIAGNOSIS — R05 Cough: Secondary | ICD-10-CM | POA: Diagnosis not present

## 2020-02-19 DIAGNOSIS — E7800 Pure hypercholesterolemia, unspecified: Secondary | ICD-10-CM | POA: Diagnosis not present

## 2020-02-19 DIAGNOSIS — Z79899 Other long term (current) drug therapy: Secondary | ICD-10-CM | POA: Diagnosis not present

## 2020-02-24 DIAGNOSIS — I700 Atherosclerosis of aorta: Secondary | ICD-10-CM | POA: Diagnosis not present

## 2020-02-24 DIAGNOSIS — E7800 Pure hypercholesterolemia, unspecified: Secondary | ICD-10-CM | POA: Diagnosis not present

## 2020-02-24 DIAGNOSIS — K219 Gastro-esophageal reflux disease without esophagitis: Secondary | ICD-10-CM | POA: Diagnosis not present

## 2020-02-24 DIAGNOSIS — Z23 Encounter for immunization: Secondary | ICD-10-CM | POA: Diagnosis not present

## 2020-02-24 DIAGNOSIS — Z6829 Body mass index (BMI) 29.0-29.9, adult: Secondary | ICD-10-CM | POA: Diagnosis not present

## 2020-02-24 DIAGNOSIS — F32A Depression, unspecified: Secondary | ICD-10-CM | POA: Diagnosis not present

## 2020-02-24 DIAGNOSIS — I10 Essential (primary) hypertension: Secondary | ICD-10-CM | POA: Diagnosis not present

## 2020-04-15 IMAGING — CT CT KNEE*R* W/O CM
3 series · 15 of 33 positions shown, 18 images · non-contrast
Comparison: Radiograph 05/23/2002

CLINICAL DATA: Fall with knee pain

EXAM:
CT OF THE right KNEE WITHOUT CONTRAST
TECHNIQUE: Multidetector CT imaging of the right knee was performed according
to the standard protocol. Multiplanar CT image reconstructions were
also generated.

[Series 4: lower ext 1.5 st · axial · 0.37mm/px · z∈[+402,+582]mm · 7 of 142 slices shown, 9 images]
[im 11/142  soft-tissue]
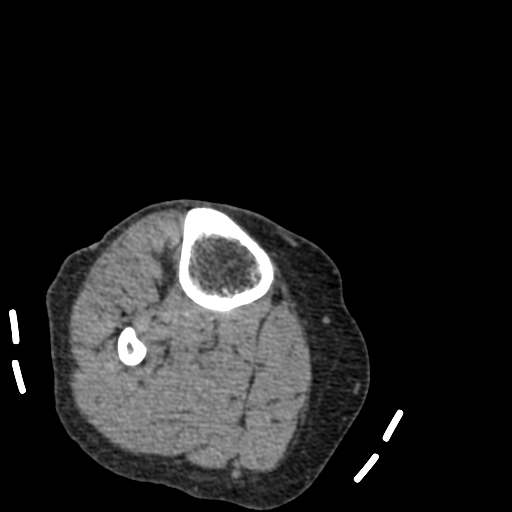
[im 11/142  bone]
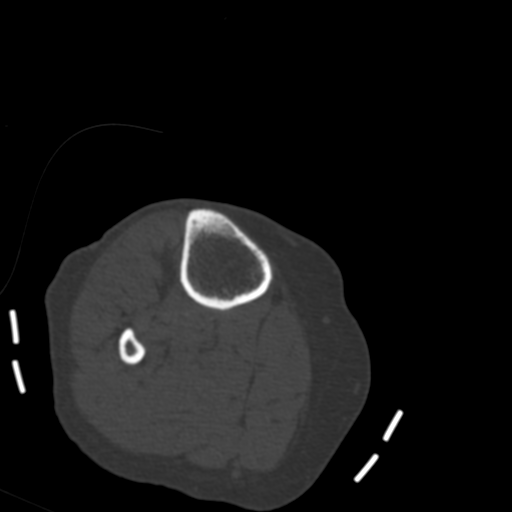
[im 33/142  bone]
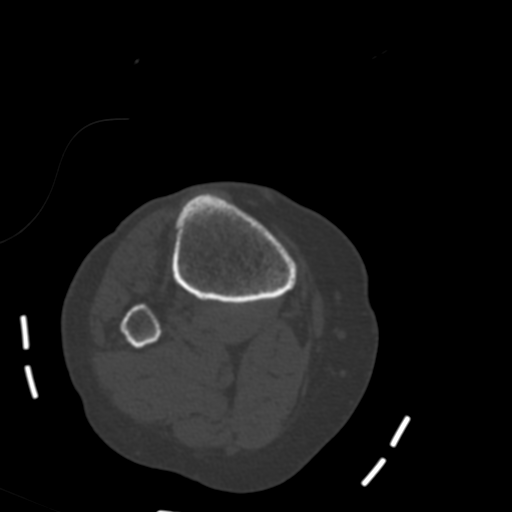
[im 55/142  bone]
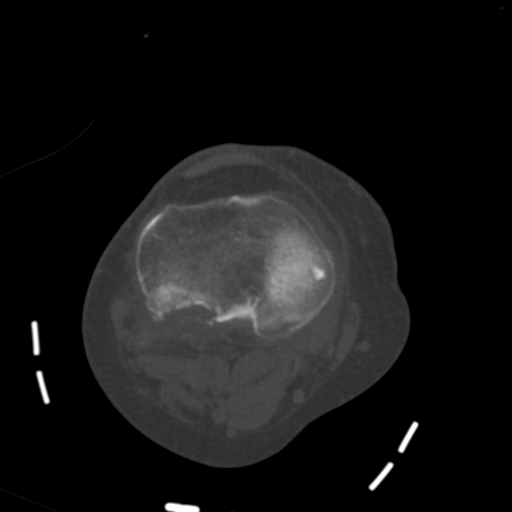
[im 76/142  bone]
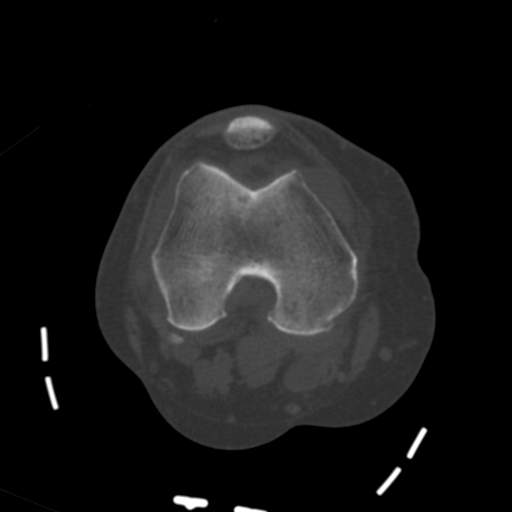
[im 87/142  soft-tissue]
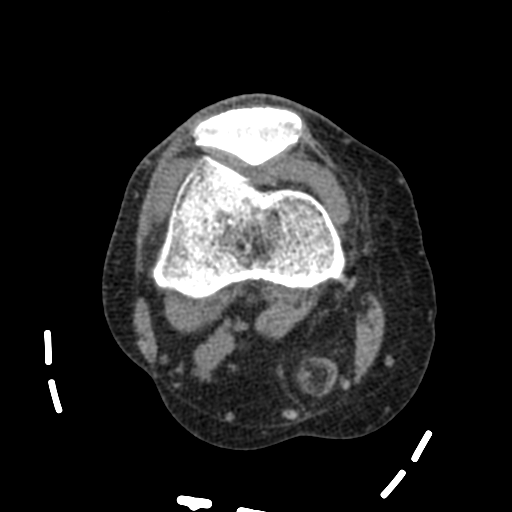
[im 87/142  bone]
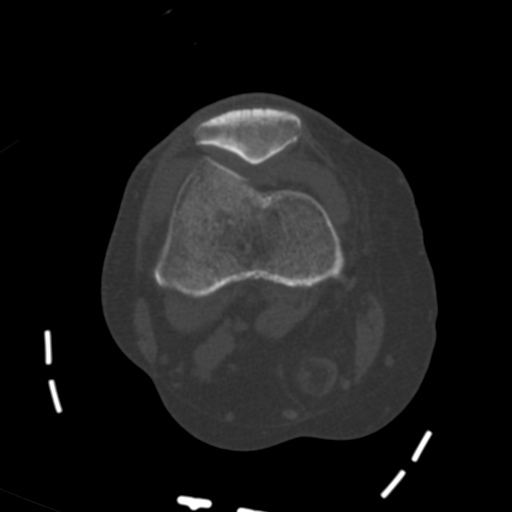
[im 109/142  bone]
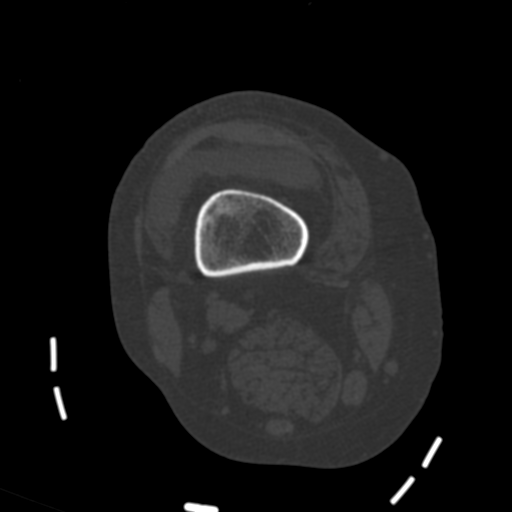
[im 131/142  bone]
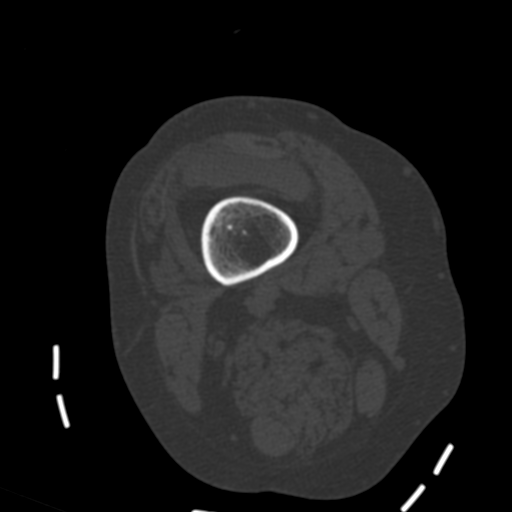

[Series 9: lower ext cor st · coronal · 0.29mm/px · 3 of 84 slices shown]
[im 17/84  bone]
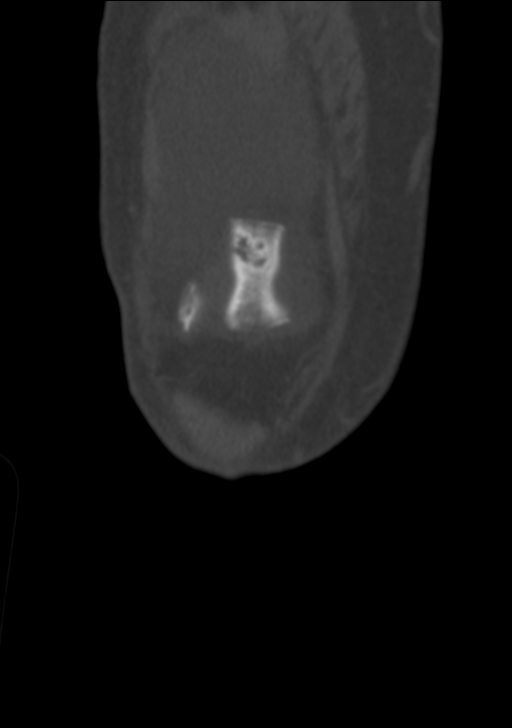
[im 34/84  bone]
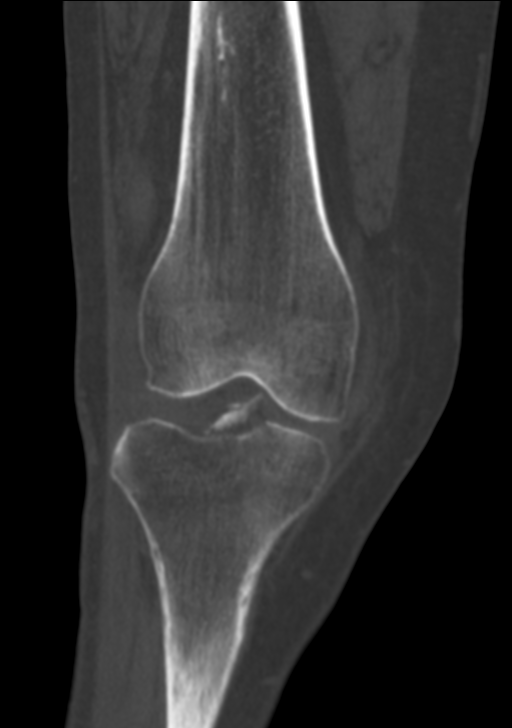
[im 50/84  bone]
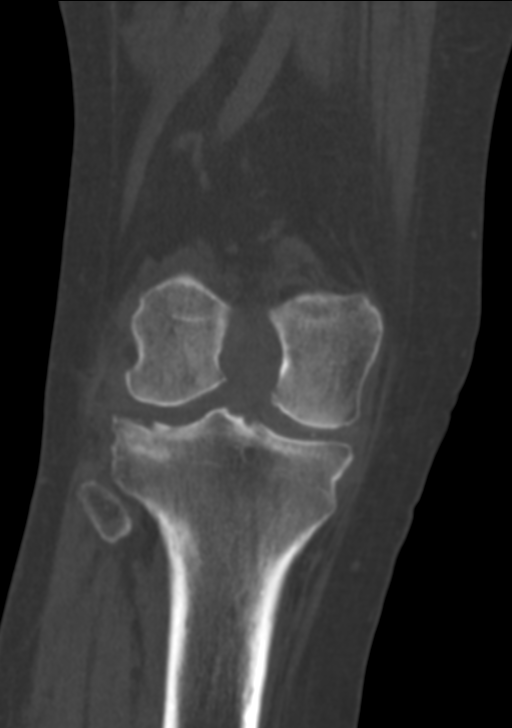

[Series 10: lower ext sag st · sagittal · 0.41mm/px · 5 of 100 slices shown, 6 images]
[im 34/100  bone]
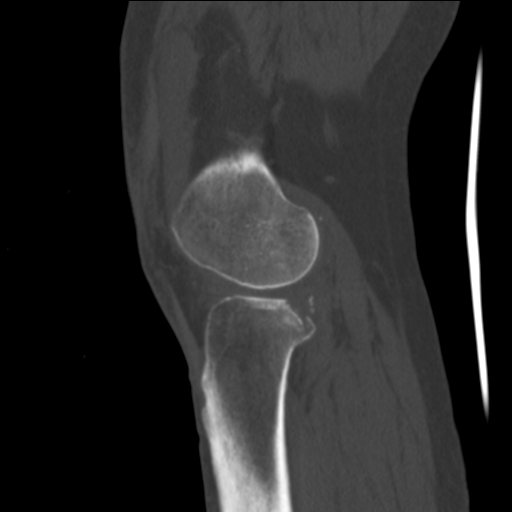
[im 42/100  bone]
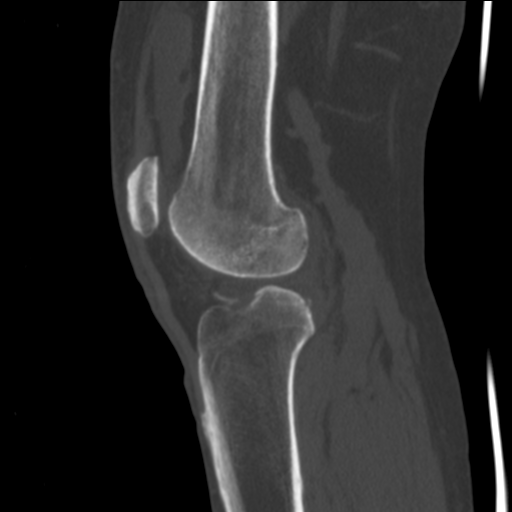
[im 50/100  soft-tissue]
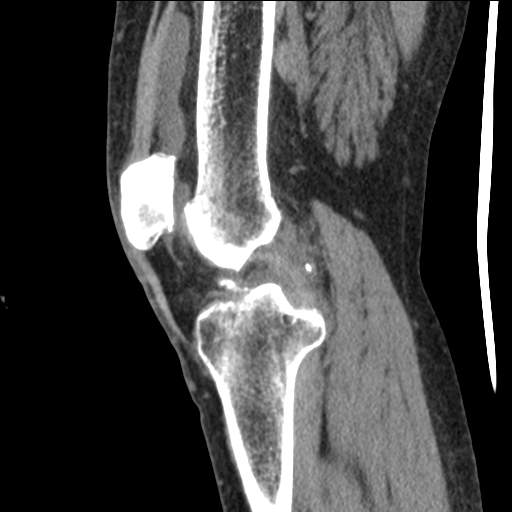
[im 50/100  bone]
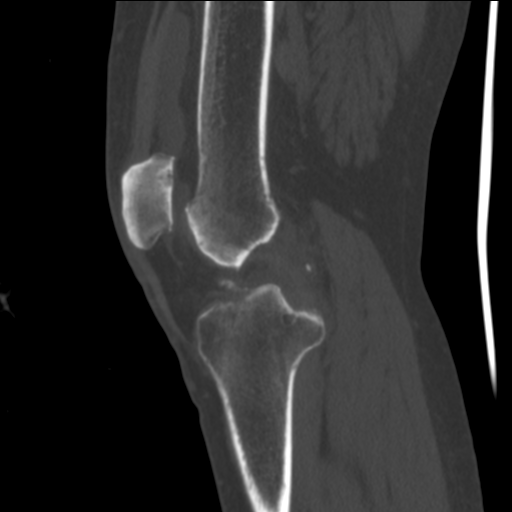
[im 58/100  bone]
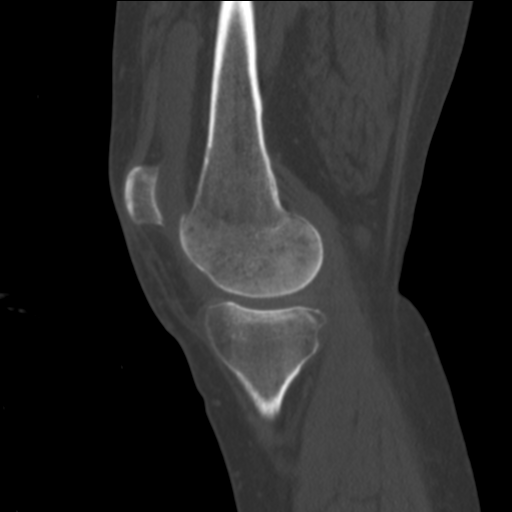
[im 67/100  bone]
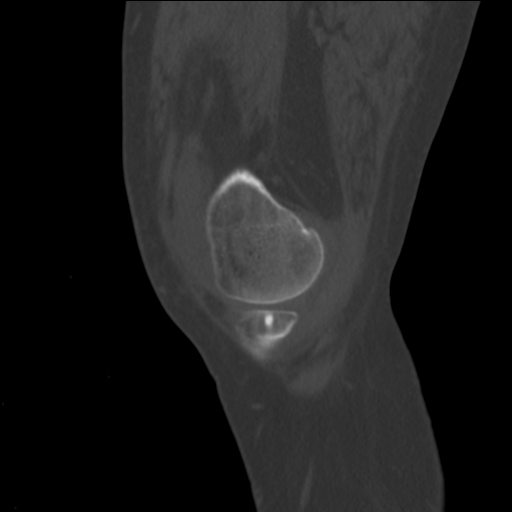

[15 of 33 positions shown; findings below may reference images not displayed]

FINDINGS: Bones/Joint/Cartilage

Acute, mildly comminuted and displaced fracture involving the
posterior lateral articular surface of the proximal tibia with about
6 mm of depression. Intra-articular avulsion fracture involving the
anterior articular surface of the tibia in the region of ACL
insertion. Small bone fragments between the tibial spines. Focal
sclerosis in the medial tibial plateau likely a bone island. Sub
articular cysts within the upper patella. Moderate hemarthrosis of
the knee.

Ligaments

Suboptimally assessed by CT.

Muscles and Tendons

Minimal muscular atrophy. Quadriceps tendon and patellar tendon
appear intact.

Soft tissues

Soft tissue edema at the knee.  14 mm medial popliteal fossa cyst.
IMPRESSION: 1. Acute, slightly comminuted fracture involving the posterolateral
plateau of the proximal tibia with about 6 mm depression.
2. Acute intra-articular avulsion fracture injury involving the
anterior articular surface of the tibia in the region of ACL
insertion. Several small osseous fragment/loose bodies within the
central joint.
3. Moderate hemarthrosis

## 2020-05-17 DIAGNOSIS — Z139 Encounter for screening, unspecified: Secondary | ICD-10-CM | POA: Diagnosis not present

## 2020-05-17 DIAGNOSIS — Z0000 Encounter for general adult medical examination without abnormal findings: Secondary | ICD-10-CM | POA: Diagnosis not present

## 2020-05-17 DIAGNOSIS — Z9181 History of falling: Secondary | ICD-10-CM | POA: Diagnosis not present

## 2020-06-16 DIAGNOSIS — Z20822 Contact with and (suspected) exposure to covid-19: Secondary | ICD-10-CM | POA: Diagnosis not present

## 2020-07-16 DIAGNOSIS — R300 Dysuria: Secondary | ICD-10-CM | POA: Diagnosis not present

## 2020-07-16 DIAGNOSIS — N3091 Cystitis, unspecified with hematuria: Secondary | ICD-10-CM | POA: Diagnosis not present

## 2020-07-16 DIAGNOSIS — K649 Unspecified hemorrhoids: Secondary | ICD-10-CM | POA: Diagnosis not present

## 2020-07-16 DIAGNOSIS — K6289 Other specified diseases of anus and rectum: Secondary | ICD-10-CM | POA: Diagnosis not present

## 2020-08-19 DIAGNOSIS — R61 Generalized hyperhidrosis: Secondary | ICD-10-CM | POA: Diagnosis not present

## 2020-08-19 DIAGNOSIS — Z79899 Other long term (current) drug therapy: Secondary | ICD-10-CM | POA: Diagnosis not present

## 2020-08-19 DIAGNOSIS — E7800 Pure hypercholesterolemia, unspecified: Secondary | ICD-10-CM | POA: Diagnosis not present

## 2020-08-23 DIAGNOSIS — I10 Essential (primary) hypertension: Secondary | ICD-10-CM | POA: Diagnosis not present

## 2020-08-23 DIAGNOSIS — E7800 Pure hypercholesterolemia, unspecified: Secondary | ICD-10-CM | POA: Diagnosis not present

## 2020-08-23 DIAGNOSIS — R002 Palpitations: Secondary | ICD-10-CM | POA: Diagnosis not present

## 2020-08-23 DIAGNOSIS — F32A Depression, unspecified: Secondary | ICD-10-CM | POA: Diagnosis not present

## 2020-08-23 DIAGNOSIS — I700 Atherosclerosis of aorta: Secondary | ICD-10-CM | POA: Diagnosis not present

## 2020-08-23 DIAGNOSIS — R61 Generalized hyperhidrosis: Secondary | ICD-10-CM | POA: Diagnosis not present

## 2020-09-30 DIAGNOSIS — L299 Pruritus, unspecified: Secondary | ICD-10-CM | POA: Diagnosis not present

## 2020-09-30 DIAGNOSIS — L0100 Impetigo, unspecified: Secondary | ICD-10-CM | POA: Diagnosis not present

## 2021-02-14 DIAGNOSIS — L818 Other specified disorders of pigmentation: Secondary | ICD-10-CM | POA: Diagnosis not present

## 2021-02-28 DIAGNOSIS — E7800 Pure hypercholesterolemia, unspecified: Secondary | ICD-10-CM | POA: Diagnosis not present

## 2021-02-28 DIAGNOSIS — Z79899 Other long term (current) drug therapy: Secondary | ICD-10-CM | POA: Diagnosis not present

## 2021-03-03 DIAGNOSIS — R300 Dysuria: Secondary | ICD-10-CM | POA: Diagnosis not present

## 2021-03-03 DIAGNOSIS — R1084 Generalized abdominal pain: Secondary | ICD-10-CM | POA: Diagnosis not present

## 2021-03-07 DIAGNOSIS — I10 Essential (primary) hypertension: Secondary | ICD-10-CM | POA: Diagnosis not present

## 2021-03-07 DIAGNOSIS — E7800 Pure hypercholesterolemia, unspecified: Secondary | ICD-10-CM | POA: Diagnosis not present

## 2021-03-07 DIAGNOSIS — Z139 Encounter for screening, unspecified: Secondary | ICD-10-CM | POA: Diagnosis not present

## 2021-03-07 DIAGNOSIS — K862 Cyst of pancreas: Secondary | ICD-10-CM | POA: Diagnosis not present

## 2021-03-07 DIAGNOSIS — I700 Atherosclerosis of aorta: Secondary | ICD-10-CM | POA: Diagnosis not present

## 2021-03-07 DIAGNOSIS — R002 Palpitations: Secondary | ICD-10-CM | POA: Diagnosis not present

## 2021-03-07 DIAGNOSIS — R053 Chronic cough: Secondary | ICD-10-CM | POA: Diagnosis not present

## 2021-03-07 DIAGNOSIS — K219 Gastro-esophageal reflux disease without esophagitis: Secondary | ICD-10-CM | POA: Diagnosis not present

## 2021-03-07 DIAGNOSIS — Z23 Encounter for immunization: Secondary | ICD-10-CM | POA: Diagnosis not present

## 2021-03-07 DIAGNOSIS — R1010 Upper abdominal pain, unspecified: Secondary | ICD-10-CM | POA: Diagnosis not present

## 2021-03-14 DIAGNOSIS — R053 Chronic cough: Secondary | ICD-10-CM | POA: Diagnosis not present

## 2021-03-18 DIAGNOSIS — K862 Cyst of pancreas: Secondary | ICD-10-CM | POA: Diagnosis not present

## 2021-03-18 DIAGNOSIS — K7689 Other specified diseases of liver: Secondary | ICD-10-CM | POA: Diagnosis not present

## 2021-04-23 DIAGNOSIS — J029 Acute pharyngitis, unspecified: Secondary | ICD-10-CM | POA: Diagnosis not present

## 2021-05-23 DIAGNOSIS — E785 Hyperlipidemia, unspecified: Secondary | ICD-10-CM | POA: Diagnosis not present

## 2021-05-23 DIAGNOSIS — Z9181 History of falling: Secondary | ICD-10-CM | POA: Diagnosis not present

## 2021-05-23 DIAGNOSIS — Z0000 Encounter for general adult medical examination without abnormal findings: Secondary | ICD-10-CM | POA: Diagnosis not present

## 2021-07-06 DIAGNOSIS — J069 Acute upper respiratory infection, unspecified: Secondary | ICD-10-CM | POA: Diagnosis not present

## 2021-07-06 DIAGNOSIS — R252 Cramp and spasm: Secondary | ICD-10-CM | POA: Diagnosis not present

## 2021-07-11 DIAGNOSIS — R051 Acute cough: Secondary | ICD-10-CM | POA: Diagnosis not present

## 2021-07-11 DIAGNOSIS — J019 Acute sinusitis, unspecified: Secondary | ICD-10-CM | POA: Diagnosis not present

## 2021-07-11 DIAGNOSIS — R0981 Nasal congestion: Secondary | ICD-10-CM | POA: Diagnosis not present

## 2021-07-11 DIAGNOSIS — J029 Acute pharyngitis, unspecified: Secondary | ICD-10-CM | POA: Diagnosis not present

## 2021-07-11 DIAGNOSIS — R509 Fever, unspecified: Secondary | ICD-10-CM | POA: Diagnosis not present

## 2021-09-06 DIAGNOSIS — Z79899 Other long term (current) drug therapy: Secondary | ICD-10-CM | POA: Diagnosis not present

## 2021-09-06 DIAGNOSIS — E7800 Pure hypercholesterolemia, unspecified: Secondary | ICD-10-CM | POA: Diagnosis not present

## 2021-09-09 DIAGNOSIS — R739 Hyperglycemia, unspecified: Secondary | ICD-10-CM | POA: Diagnosis not present

## 2021-09-09 DIAGNOSIS — R053 Chronic cough: Secondary | ICD-10-CM | POA: Diagnosis not present

## 2021-09-09 DIAGNOSIS — E7800 Pure hypercholesterolemia, unspecified: Secondary | ICD-10-CM | POA: Diagnosis not present

## 2021-09-09 DIAGNOSIS — R252 Cramp and spasm: Secondary | ICD-10-CM | POA: Diagnosis not present

## 2021-09-09 DIAGNOSIS — R002 Palpitations: Secondary | ICD-10-CM | POA: Diagnosis not present

## 2021-09-09 DIAGNOSIS — I10 Essential (primary) hypertension: Secondary | ICD-10-CM | POA: Diagnosis not present

## 2021-09-09 DIAGNOSIS — K219 Gastro-esophageal reflux disease without esophagitis: Secondary | ICD-10-CM | POA: Diagnosis not present

## 2021-09-09 DIAGNOSIS — K862 Cyst of pancreas: Secondary | ICD-10-CM | POA: Diagnosis not present

## 2021-09-09 DIAGNOSIS — I700 Atherosclerosis of aorta: Secondary | ICD-10-CM | POA: Diagnosis not present

## 2021-09-19 DIAGNOSIS — Z1231 Encounter for screening mammogram for malignant neoplasm of breast: Secondary | ICD-10-CM | POA: Diagnosis not present

## 2021-10-28 DIAGNOSIS — H25813 Combined forms of age-related cataract, bilateral: Secondary | ICD-10-CM | POA: Diagnosis not present

## 2022-01-02 DIAGNOSIS — J029 Acute pharyngitis, unspecified: Secondary | ICD-10-CM | POA: Diagnosis not present

## 2022-01-02 DIAGNOSIS — R051 Acute cough: Secondary | ICD-10-CM | POA: Diagnosis not present

## 2022-01-02 DIAGNOSIS — J309 Allergic rhinitis, unspecified: Secondary | ICD-10-CM | POA: Diagnosis not present

## 2022-01-16 DIAGNOSIS — M25562 Pain in left knee: Secondary | ICD-10-CM | POA: Diagnosis not present

## 2022-01-16 DIAGNOSIS — Z79899 Other long term (current) drug therapy: Secondary | ICD-10-CM | POA: Diagnosis not present

## 2022-01-16 DIAGNOSIS — S8392XA Sprain of unspecified site of left knee, initial encounter: Secondary | ICD-10-CM | POA: Diagnosis not present

## 2022-01-16 DIAGNOSIS — S8992XD Unspecified injury of left lower leg, subsequent encounter: Secondary | ICD-10-CM | POA: Diagnosis not present

## 2022-01-16 DIAGNOSIS — R0981 Nasal congestion: Secondary | ICD-10-CM | POA: Diagnosis not present

## 2022-01-16 DIAGNOSIS — R059 Cough, unspecified: Secondary | ICD-10-CM | POA: Diagnosis not present

## 2022-01-16 DIAGNOSIS — B348 Other viral infections of unspecified site: Secondary | ICD-10-CM | POA: Diagnosis not present

## 2022-01-16 DIAGNOSIS — Z20822 Contact with and (suspected) exposure to covid-19: Secondary | ICD-10-CM | POA: Diagnosis not present

## 2022-01-16 DIAGNOSIS — I10 Essential (primary) hypertension: Secondary | ICD-10-CM | POA: Diagnosis not present

## 2022-01-25 DIAGNOSIS — R0982 Postnasal drip: Secondary | ICD-10-CM | POA: Diagnosis not present

## 2022-01-25 DIAGNOSIS — M25562 Pain in left knee: Secondary | ICD-10-CM | POA: Diagnosis not present

## 2022-01-25 DIAGNOSIS — J309 Allergic rhinitis, unspecified: Secondary | ICD-10-CM | POA: Diagnosis not present

## 2022-01-25 DIAGNOSIS — M5416 Radiculopathy, lumbar region: Secondary | ICD-10-CM | POA: Diagnosis not present

## 2022-01-25 DIAGNOSIS — R053 Chronic cough: Secondary | ICD-10-CM | POA: Diagnosis not present

## 2022-02-28 DIAGNOSIS — M25562 Pain in left knee: Secondary | ICD-10-CM | POA: Diagnosis not present

## 2022-02-28 DIAGNOSIS — M5442 Lumbago with sciatica, left side: Secondary | ICD-10-CM | POA: Diagnosis not present

## 2022-02-28 DIAGNOSIS — Z23 Encounter for immunization: Secondary | ICD-10-CM | POA: Diagnosis not present

## 2022-02-28 DIAGNOSIS — M25552 Pain in left hip: Secondary | ICD-10-CM | POA: Diagnosis not present

## 2022-03-09 DIAGNOSIS — M1712 Unilateral primary osteoarthritis, left knee: Secondary | ICD-10-CM | POA: Diagnosis not present

## 2022-03-13 DIAGNOSIS — R739 Hyperglycemia, unspecified: Secondary | ICD-10-CM | POA: Diagnosis not present

## 2022-03-13 DIAGNOSIS — Z79899 Other long term (current) drug therapy: Secondary | ICD-10-CM | POA: Diagnosis not present

## 2022-03-13 DIAGNOSIS — E7800 Pure hypercholesterolemia, unspecified: Secondary | ICD-10-CM | POA: Diagnosis not present

## 2022-03-20 DIAGNOSIS — K219 Gastro-esophageal reflux disease without esophagitis: Secondary | ICD-10-CM | POA: Diagnosis not present

## 2022-03-20 DIAGNOSIS — Z139 Encounter for screening, unspecified: Secondary | ICD-10-CM | POA: Diagnosis not present

## 2022-03-20 DIAGNOSIS — R002 Palpitations: Secondary | ICD-10-CM | POA: Diagnosis not present

## 2022-03-20 DIAGNOSIS — E7800 Pure hypercholesterolemia, unspecified: Secondary | ICD-10-CM | POA: Diagnosis not present

## 2022-03-20 DIAGNOSIS — I10 Essential (primary) hypertension: Secondary | ICD-10-CM | POA: Diagnosis not present

## 2022-03-20 DIAGNOSIS — R252 Cramp and spasm: Secondary | ICD-10-CM | POA: Diagnosis not present

## 2022-03-25 DIAGNOSIS — R400 Somnolence: Secondary | ICD-10-CM | POA: Diagnosis not present

## 2022-03-25 DIAGNOSIS — R531 Weakness: Secondary | ICD-10-CM | POA: Diagnosis not present

## 2022-03-25 DIAGNOSIS — T481X5A Adverse effect of skeletal muscle relaxants [neuromuscular blocking agents], initial encounter: Secondary | ICD-10-CM | POA: Diagnosis not present

## 2022-03-25 DIAGNOSIS — Z743 Need for continuous supervision: Secondary | ICD-10-CM | POA: Diagnosis not present

## 2022-03-25 DIAGNOSIS — R55 Syncope and collapse: Secondary | ICD-10-CM | POA: Diagnosis not present

## 2022-03-25 DIAGNOSIS — I10 Essential (primary) hypertension: Secondary | ICD-10-CM | POA: Diagnosis not present

## 2022-03-25 DIAGNOSIS — M6281 Muscle weakness (generalized): Secondary | ICD-10-CM | POA: Diagnosis not present

## 2022-03-25 DIAGNOSIS — R6889 Other general symptoms and signs: Secondary | ICD-10-CM | POA: Diagnosis not present

## 2022-03-25 DIAGNOSIS — R0689 Other abnormalities of breathing: Secondary | ICD-10-CM | POA: Diagnosis not present

## 2022-03-25 DIAGNOSIS — R252 Cramp and spasm: Secondary | ICD-10-CM | POA: Diagnosis not present

## 2022-03-25 DIAGNOSIS — R404 Transient alteration of awareness: Secondary | ICD-10-CM | POA: Diagnosis not present

## 2022-04-11 ENCOUNTER — Telehealth: Payer: Self-pay

## 2022-04-11 NOTE — Telephone Encounter (Signed)
        Patient  visited [facility] on 03/26/2022  for not in Epic chart.   Telephone encounter attempt :  1st  A HIPAA compliant voice message was left requesting a return call.  Instructed patient to call back at [phone].   Philopateer Strine [facility]  [facility] Population Health Community Resource Care Guide   [email or web address]  ?? [phone]   Website: [email or web address]  [email or web address]

## 2022-04-12 ENCOUNTER — Telehealth: Payer: Self-pay

## 2022-04-12 NOTE — Telephone Encounter (Signed)
        Patient  visited [facility] on 03/26/2022  for not in Epic chart.   Telephone encounter attempt :  2nd  A HIPAA compliant voice message was left requesting a return call.  Instructed patient to call back at [phone].   Ashling Roane [facility]  [facility] Population Health Community Resource Care Guide   [email or web address]  ?? [phone]   Website: [email or web address]  [email or web address]

## 2022-04-13 ENCOUNTER — Telehealth: Payer: Self-pay

## 2022-04-13 NOTE — Telephone Encounter (Signed)
        Patient  visited [facility] on 03/26/2022  for not in Epic chart.   Telephone encounter attempt :  3rd  A HIPAA compliant voice message was left requesting a return call.  Instructed patient to call back at [phone].   Timo Hartwig [facility]  [facility] Population Health Community Resource Care Guide   [email or web address]  ?? [phone]   Website: [email or web address]

## 2022-04-19 DIAGNOSIS — R634 Abnormal weight loss: Secondary | ICD-10-CM | POA: Diagnosis not present

## 2022-04-19 DIAGNOSIS — R194 Change in bowel habit: Secondary | ICD-10-CM | POA: Diagnosis not present

## 2022-04-19 DIAGNOSIS — I10 Essential (primary) hypertension: Secondary | ICD-10-CM | POA: Diagnosis not present

## 2022-04-19 DIAGNOSIS — R300 Dysuria: Secondary | ICD-10-CM | POA: Diagnosis not present

## 2022-04-19 DIAGNOSIS — D696 Thrombocytopenia, unspecified: Secondary | ICD-10-CM | POA: Diagnosis not present

## 2022-04-25 DIAGNOSIS — K219 Gastro-esophageal reflux disease without esophagitis: Secondary | ICD-10-CM | POA: Diagnosis not present

## 2022-04-25 DIAGNOSIS — R1013 Epigastric pain: Secondary | ICD-10-CM | POA: Diagnosis not present

## 2022-04-25 DIAGNOSIS — I10 Essential (primary) hypertension: Secondary | ICD-10-CM | POA: Diagnosis not present

## 2022-04-25 DIAGNOSIS — Z79899 Other long term (current) drug therapy: Secondary | ICD-10-CM | POA: Diagnosis not present

## 2022-04-25 DIAGNOSIS — R110 Nausea: Secondary | ICD-10-CM | POA: Diagnosis not present

## 2022-04-30 DIAGNOSIS — R1013 Epigastric pain: Secondary | ICD-10-CM | POA: Diagnosis not present

## 2022-06-16 DIAGNOSIS — M1712 Unilateral primary osteoarthritis, left knee: Secondary | ICD-10-CM | POA: Diagnosis not present

## 2022-07-29 DIAGNOSIS — H6123 Impacted cerumen, bilateral: Secondary | ICD-10-CM | POA: Diagnosis not present

## 2022-09-11 DIAGNOSIS — R194 Change in bowel habit: Secondary | ICD-10-CM | POA: Diagnosis not present

## 2022-09-11 DIAGNOSIS — J309 Allergic rhinitis, unspecified: Secondary | ICD-10-CM | POA: Diagnosis not present

## 2022-09-11 DIAGNOSIS — R252 Cramp and spasm: Secondary | ICD-10-CM | POA: Diagnosis not present

## 2022-09-11 DIAGNOSIS — I10 Essential (primary) hypertension: Secondary | ICD-10-CM | POA: Diagnosis not present

## 2022-09-11 DIAGNOSIS — K219 Gastro-esophageal reflux disease without esophagitis: Secondary | ICD-10-CM | POA: Diagnosis not present

## 2022-09-11 DIAGNOSIS — H1010 Acute atopic conjunctivitis, unspecified eye: Secondary | ICD-10-CM | POA: Diagnosis not present

## 2022-09-11 DIAGNOSIS — I700 Atherosclerosis of aorta: Secondary | ICD-10-CM | POA: Diagnosis not present

## 2022-09-11 DIAGNOSIS — Z9181 History of falling: Secondary | ICD-10-CM | POA: Diagnosis not present

## 2022-09-11 DIAGNOSIS — Z79899 Other long term (current) drug therapy: Secondary | ICD-10-CM | POA: Diagnosis not present

## 2022-09-11 DIAGNOSIS — E7800 Pure hypercholesterolemia, unspecified: Secondary | ICD-10-CM | POA: Diagnosis not present

## 2022-09-11 DIAGNOSIS — R002 Palpitations: Secondary | ICD-10-CM | POA: Diagnosis not present

## 2022-10-17 DIAGNOSIS — S134XXA Sprain of ligaments of cervical spine, initial encounter: Secondary | ICD-10-CM | POA: Diagnosis not present

## 2022-10-17 DIAGNOSIS — M47812 Spondylosis without myelopathy or radiculopathy, cervical region: Secondary | ICD-10-CM | POA: Diagnosis not present

## 2022-10-17 DIAGNOSIS — M542 Cervicalgia: Secondary | ICD-10-CM | POA: Diagnosis not present

## 2022-10-17 DIAGNOSIS — Z9889 Other specified postprocedural states: Secondary | ICD-10-CM | POA: Diagnosis not present

## 2022-10-17 DIAGNOSIS — M5031 Other cervical disc degeneration,  high cervical region: Secondary | ICD-10-CM | POA: Diagnosis not present

## 2022-10-17 DIAGNOSIS — Z981 Arthrodesis status: Secondary | ICD-10-CM | POA: Diagnosis not present

## 2022-10-22 DIAGNOSIS — I10 Essential (primary) hypertension: Secondary | ICD-10-CM | POA: Diagnosis not present

## 2022-10-22 DIAGNOSIS — Z79899 Other long term (current) drug therapy: Secondary | ICD-10-CM | POA: Diagnosis not present

## 2022-10-22 DIAGNOSIS — K219 Gastro-esophageal reflux disease without esophagitis: Secondary | ICD-10-CM | POA: Diagnosis not present

## 2022-10-22 DIAGNOSIS — Z7982 Long term (current) use of aspirin: Secondary | ICD-10-CM | POA: Diagnosis not present

## 2022-10-22 DIAGNOSIS — R109 Unspecified abdominal pain: Secondary | ICD-10-CM | POA: Diagnosis not present

## 2022-10-22 DIAGNOSIS — Z87442 Personal history of urinary calculi: Secondary | ICD-10-CM | POA: Diagnosis not present

## 2022-10-22 DIAGNOSIS — R309 Painful micturition, unspecified: Secondary | ICD-10-CM | POA: Diagnosis not present

## 2022-10-30 DIAGNOSIS — M1712 Unilateral primary osteoarthritis, left knee: Secondary | ICD-10-CM | POA: Diagnosis not present

## 2022-12-11 DIAGNOSIS — R600 Localized edema: Secondary | ICD-10-CM | POA: Diagnosis not present

## 2022-12-11 DIAGNOSIS — R5383 Other fatigue: Secondary | ICD-10-CM | POA: Diagnosis not present

## 2022-12-11 DIAGNOSIS — N8111 Cystocele, midline: Secondary | ICD-10-CM | POA: Diagnosis not present

## 2022-12-11 DIAGNOSIS — R0609 Other forms of dyspnea: Secondary | ICD-10-CM | POA: Diagnosis not present

## 2022-12-11 DIAGNOSIS — I10 Essential (primary) hypertension: Secondary | ICD-10-CM | POA: Diagnosis not present

## 2023-01-05 DIAGNOSIS — M79622 Pain in left upper arm: Secondary | ICD-10-CM | POA: Diagnosis not present

## 2023-03-01 DIAGNOSIS — M1712 Unilateral primary osteoarthritis, left knee: Secondary | ICD-10-CM | POA: Diagnosis not present

## 2023-03-05 DIAGNOSIS — Z79899 Other long term (current) drug therapy: Secondary | ICD-10-CM | POA: Diagnosis not present

## 2023-03-05 DIAGNOSIS — R194 Change in bowel habit: Secondary | ICD-10-CM | POA: Diagnosis not present

## 2023-03-05 DIAGNOSIS — R002 Palpitations: Secondary | ICD-10-CM | POA: Diagnosis not present

## 2023-03-05 DIAGNOSIS — K219 Gastro-esophageal reflux disease without esophagitis: Secondary | ICD-10-CM | POA: Diagnosis not present

## 2023-03-05 DIAGNOSIS — R600 Localized edema: Secondary | ICD-10-CM | POA: Diagnosis not present

## 2023-03-05 DIAGNOSIS — I700 Atherosclerosis of aorta: Secondary | ICD-10-CM | POA: Diagnosis not present

## 2023-03-05 DIAGNOSIS — E7800 Pure hypercholesterolemia, unspecified: Secondary | ICD-10-CM | POA: Diagnosis not present

## 2023-03-05 DIAGNOSIS — I10 Essential (primary) hypertension: Secondary | ICD-10-CM | POA: Diagnosis not present

## 2023-03-07 DIAGNOSIS — R0609 Other forms of dyspnea: Secondary | ICD-10-CM | POA: Diagnosis not present

## 2023-03-09 DIAGNOSIS — R0609 Other forms of dyspnea: Secondary | ICD-10-CM | POA: Diagnosis not present

## 2023-03-12 DIAGNOSIS — M1711 Unilateral primary osteoarthritis, right knee: Secondary | ICD-10-CM | POA: Diagnosis not present

## 2023-06-12 DIAGNOSIS — K30 Functional dyspepsia: Secondary | ICD-10-CM | POA: Diagnosis not present

## 2023-06-12 DIAGNOSIS — I10 Essential (primary) hypertension: Secondary | ICD-10-CM | POA: Diagnosis not present

## 2023-06-12 DIAGNOSIS — E7800 Pure hypercholesterolemia, unspecified: Secondary | ICD-10-CM | POA: Diagnosis not present

## 2023-06-12 DIAGNOSIS — Z79899 Other long term (current) drug therapy: Secondary | ICD-10-CM | POA: Diagnosis not present

## 2023-06-12 DIAGNOSIS — M1990 Unspecified osteoarthritis, unspecified site: Secondary | ICD-10-CM | POA: Diagnosis not present

## 2023-06-12 DIAGNOSIS — Z7982 Long term (current) use of aspirin: Secondary | ICD-10-CM | POA: Diagnosis not present

## 2023-06-12 DIAGNOSIS — R079 Chest pain, unspecified: Secondary | ICD-10-CM | POA: Diagnosis not present

## 2023-07-10 DIAGNOSIS — R070 Pain in throat: Secondary | ICD-10-CM | POA: Diagnosis not present

## 2023-07-12 DIAGNOSIS — K30 Functional dyspepsia: Secondary | ICD-10-CM | POA: Diagnosis not present

## 2023-08-15 DIAGNOSIS — E7800 Pure hypercholesterolemia, unspecified: Secondary | ICD-10-CM | POA: Diagnosis not present

## 2023-08-15 DIAGNOSIS — Z1152 Encounter for screening for COVID-19: Secondary | ICD-10-CM | POA: Diagnosis not present

## 2023-08-15 DIAGNOSIS — K219 Gastro-esophageal reflux disease without esophagitis: Secondary | ICD-10-CM | POA: Diagnosis not present

## 2023-08-15 DIAGNOSIS — R1011 Right upper quadrant pain: Secondary | ICD-10-CM | POA: Diagnosis not present

## 2023-08-15 DIAGNOSIS — Z7982 Long term (current) use of aspirin: Secondary | ICD-10-CM | POA: Diagnosis not present

## 2023-08-15 DIAGNOSIS — R000 Tachycardia, unspecified: Secondary | ICD-10-CM | POA: Diagnosis not present

## 2023-08-15 DIAGNOSIS — M1990 Unspecified osteoarthritis, unspecified site: Secondary | ICD-10-CM | POA: Diagnosis not present

## 2023-08-15 DIAGNOSIS — R112 Nausea with vomiting, unspecified: Secondary | ICD-10-CM | POA: Diagnosis not present

## 2023-08-15 DIAGNOSIS — R197 Diarrhea, unspecified: Secondary | ICD-10-CM | POA: Diagnosis not present

## 2023-08-15 DIAGNOSIS — R1110 Vomiting, unspecified: Secondary | ICD-10-CM | POA: Diagnosis not present

## 2023-08-15 DIAGNOSIS — Z79899 Other long term (current) drug therapy: Secondary | ICD-10-CM | POA: Diagnosis not present

## 2023-08-15 DIAGNOSIS — I10 Essential (primary) hypertension: Secondary | ICD-10-CM | POA: Diagnosis not present

## 2023-08-26 DIAGNOSIS — R197 Diarrhea, unspecified: Secondary | ICD-10-CM | POA: Diagnosis not present

## 2023-08-26 DIAGNOSIS — R112 Nausea with vomiting, unspecified: Secondary | ICD-10-CM | POA: Diagnosis not present

## 2023-08-30 DIAGNOSIS — Z79899 Other long term (current) drug therapy: Secondary | ICD-10-CM | POA: Diagnosis not present

## 2023-08-30 DIAGNOSIS — E7800 Pure hypercholesterolemia, unspecified: Secondary | ICD-10-CM | POA: Diagnosis not present

## 2023-09-03 DIAGNOSIS — R002 Palpitations: Secondary | ICD-10-CM | POA: Diagnosis not present

## 2023-09-03 DIAGNOSIS — Z23 Encounter for immunization: Secondary | ICD-10-CM | POA: Diagnosis not present

## 2023-09-03 DIAGNOSIS — R600 Localized edema: Secondary | ICD-10-CM | POA: Diagnosis not present

## 2023-09-03 DIAGNOSIS — E7800 Pure hypercholesterolemia, unspecified: Secondary | ICD-10-CM | POA: Diagnosis not present

## 2023-09-03 DIAGNOSIS — K219 Gastro-esophageal reflux disease without esophagitis: Secondary | ICD-10-CM | POA: Diagnosis not present

## 2023-09-03 DIAGNOSIS — I10 Essential (primary) hypertension: Secondary | ICD-10-CM | POA: Diagnosis not present

## 2023-09-03 DIAGNOSIS — R194 Change in bowel habit: Secondary | ICD-10-CM | POA: Diagnosis not present

## 2023-09-03 DIAGNOSIS — Z139 Encounter for screening, unspecified: Secondary | ICD-10-CM | POA: Diagnosis not present

## 2023-09-11 DIAGNOSIS — R000 Tachycardia, unspecified: Secondary | ICD-10-CM | POA: Diagnosis not present

## 2023-09-12 DIAGNOSIS — W57XXXA Bitten or stung by nonvenomous insect and other nonvenomous arthropods, initial encounter: Secondary | ICD-10-CM | POA: Diagnosis not present

## 2023-09-12 DIAGNOSIS — L299 Pruritus, unspecified: Secondary | ICD-10-CM | POA: Diagnosis not present

## 2023-09-14 DIAGNOSIS — M1712 Unilateral primary osteoarthritis, left knee: Secondary | ICD-10-CM | POA: Diagnosis not present

## 2023-09-15 DIAGNOSIS — I471 Supraventricular tachycardia, unspecified: Secondary | ICD-10-CM | POA: Diagnosis not present

## 2023-09-15 DIAGNOSIS — I493 Ventricular premature depolarization: Secondary | ICD-10-CM | POA: Diagnosis not present

## 2023-10-31 DIAGNOSIS — R0609 Other forms of dyspnea: Secondary | ICD-10-CM | POA: Diagnosis not present

## 2023-10-31 DIAGNOSIS — Z79899 Other long term (current) drug therapy: Secondary | ICD-10-CM | POA: Diagnosis not present

## 2023-10-31 DIAGNOSIS — I471 Supraventricular tachycardia, unspecified: Secondary | ICD-10-CM | POA: Diagnosis not present

## 2023-10-31 DIAGNOSIS — I4719 Other supraventricular tachycardia: Secondary | ICD-10-CM | POA: Diagnosis not present

## 2023-10-31 DIAGNOSIS — E785 Hyperlipidemia, unspecified: Secondary | ICD-10-CM | POA: Diagnosis not present

## 2023-10-31 DIAGNOSIS — R001 Bradycardia, unspecified: Secondary | ICD-10-CM | POA: Diagnosis not present

## 2023-10-31 DIAGNOSIS — Z7982 Long term (current) use of aspirin: Secondary | ICD-10-CM | POA: Diagnosis not present

## 2023-10-31 DIAGNOSIS — I10 Essential (primary) hypertension: Secondary | ICD-10-CM | POA: Diagnosis not present

## 2023-10-31 DIAGNOSIS — K219 Gastro-esophageal reflux disease without esophagitis: Secondary | ICD-10-CM | POA: Diagnosis not present

## 2023-11-02 DIAGNOSIS — R0609 Other forms of dyspnea: Secondary | ICD-10-CM | POA: Diagnosis not present

## 2024-02-14 DIAGNOSIS — H25813 Combined forms of age-related cataract, bilateral: Secondary | ICD-10-CM | POA: Diagnosis not present

## 2024-02-15 DIAGNOSIS — H353211 Exudative age-related macular degeneration, right eye, with active choroidal neovascularization: Secondary | ICD-10-CM | POA: Diagnosis not present

## 2024-03-05 DIAGNOSIS — Z79899 Other long term (current) drug therapy: Secondary | ICD-10-CM | POA: Diagnosis not present

## 2024-03-05 DIAGNOSIS — E7800 Pure hypercholesterolemia, unspecified: Secondary | ICD-10-CM | POA: Diagnosis not present

## 2024-03-05 DIAGNOSIS — Z1159 Encounter for screening for other viral diseases: Secondary | ICD-10-CM | POA: Diagnosis not present

## 2024-03-11 DIAGNOSIS — Z23 Encounter for immunization: Secondary | ICD-10-CM | POA: Diagnosis not present

## 2024-03-11 DIAGNOSIS — M545 Low back pain, unspecified: Secondary | ICD-10-CM | POA: Diagnosis not present

## 2024-03-11 DIAGNOSIS — R002 Palpitations: Secondary | ICD-10-CM | POA: Diagnosis not present

## 2024-03-11 DIAGNOSIS — I10 Essential (primary) hypertension: Secondary | ICD-10-CM | POA: Diagnosis not present

## 2024-03-11 DIAGNOSIS — K219 Gastro-esophageal reflux disease without esophagitis: Secondary | ICD-10-CM | POA: Diagnosis not present

## 2024-03-11 DIAGNOSIS — E7800 Pure hypercholesterolemia, unspecified: Secondary | ICD-10-CM | POA: Diagnosis not present

## 2024-03-19 ENCOUNTER — Encounter: Admitting: Podiatry

## 2024-03-19 NOTE — Progress Notes (Signed)
 Patient did not show for her scheduled new patient appointment this afternoon

## 2024-07-07 ENCOUNTER — Ambulatory Visit: Admitting: Podiatry
